# Patient Record
Sex: Male | Born: 1960 | Race: White | Hispanic: No | Marital: Married | Smoking: Current every day smoker
Health system: Southern US, Community
[De-identification: ages and names within clinical notes are randomized; demographics above are authoritative.]

## PROBLEM LIST (undated history)

## (undated) DIAGNOSIS — K625 Hemorrhage of anus and rectum: Secondary | ICD-10-CM

## (undated) DIAGNOSIS — M549 Dorsalgia, unspecified: Secondary | ICD-10-CM

## (undated) HISTORY — DX: Hemorrhage of anus and rectum: K62.5

## (undated) HISTORY — PX: CIRCUMCISION: SUR203

## (undated) HISTORY — DX: Dorsalgia, unspecified: M54.9

## (undated) HISTORY — PX: CHOLECYSTECTOMY: SHX55

---

## 2015-05-12 ENCOUNTER — Encounter: Payer: Self-pay | Admitting: Emergency Medicine

## 2015-05-12 ENCOUNTER — Ambulatory Visit
Admission: EM | Admit: 2015-05-12 | Discharge: 2015-05-12 | Disposition: A | Discharge status: Home / Self Care | Payer: Worker's Compensation | Attending: Family Medicine | Admitting: Family Medicine

## 2015-05-12 ENCOUNTER — Ambulatory Visit: Payer: Worker's Compensation

## 2015-05-12 DIAGNOSIS — S62629A Displaced fracture of medial phalanx of unspecified finger, initial encounter for closed fracture: Secondary | ICD-10-CM | POA: Diagnosis not present

## 2015-05-12 DIAGNOSIS — S62619A Displaced fracture of proximal phalanx of unspecified finger, initial encounter for closed fracture: Secondary | ICD-10-CM

## 2015-05-12 MED ORDER — HYDROCODONE-ACETAMINOPHEN 5-325 MG PO TABS
1.0000 | ORAL_TABLET | Freq: Four times a day (QID) | ORAL | Status: DC | PRN
Start: 2015-05-12 — End: 2016-07-04

## 2015-05-12 NOTE — Discharge Instructions (Signed)
Finger Fracture °Fractures of fingers are breaks in the bones of the fingers. There are many types of fractures. There are different ways of treating these fractures. Your health care provider will discuss the best way to treat your fracture. °CAUSES °Traumatic injury is the main cause of broken fingers. These include: °· Injuries while playing sports. °· Workplace injuries. °· Falls. °RISK FACTORS °Activities that can increase your risk of finger fractures include: °· Sports. °· Workplace activities that involve machinery. °· A condition called osteoporosis, which can make your bones less dense and cause them to fracture more easily. °SIGNS AND SYMPTOMS °The main symptoms of a broken finger are pain and swelling within 15 minutes after the injury. Other symptoms include: °· Bruising of your finger. °· Stiffness of your finger. °· Numbness of your finger. °· Exposed bones (compound fracture) if the fracture is severe. °DIAGNOSIS  °The best way to diagnose a broken bone is with X-ray imaging. Additionally, your health care provider will use this X-ray image to evaluate the position of the broken finger bones.  °TREATMENT  °Finger fractures can be treated with:  °· Nonreduction--This means the bones are in place. The finger is splinted without changing the positions of the bone pieces. The splint is usually left on for about a week to 10 days. This will depend on your fracture and what your health care provider thinks. °· Closed reduction--The bones are put back into position without using surgery. The finger is then splinted. °· Open reduction and internal fixation--The fracture site is opened. Then the bone pieces are fixed into place with pins or some type of hardware. This is seldom required. It depends on the severity of the fracture. °HOME CARE INSTRUCTIONS  °· Follow your health care provider's instructions regarding activities, exercises, and physical therapy. °· Only take over-the-counter or prescription  medicines for pain, discomfort, or fever as directed by your health care provider. °SEEK MEDICAL CARE IF: °You have pain or swelling that limits the motion or use of your fingers. °SEEK IMMEDIATE MEDICAL CARE IF:  °Your finger becomes numb. °MAKE SURE YOU:  °· Understand these instructions. °· Will watch your condition. °· Will get help right away if you are not doing well or get worse. °  °This information is not intended to replace advice given to you by your health care provider. Make sure you discuss any questions you have with your health care provider. °  °Document Released: 10/28/2000 Document Revised: 05/06/2013 Document Reviewed: 02/25/2013 °Elsevier Interactive Patient Education ©2016 [employer] ° °Cast or Splint Care °Casts and splints support injured limbs and keep bones from moving while they heal. It is important to care for your cast or splint at home.   °HOME CARE INSTRUCTIONS °· Keep the cast or splint uncovered during the drying period. It can take 24 to 48 hours to dry if it is made of plaster. A fiberglass cast will dry in less than 1 hour. °· Do not rest the cast on anything harder than a pillow for the first 24 hours. °· Do not put weight on your injured limb or apply pressure to the cast until your health care provider gives you permission. °· Keep the cast or splint dry. Wet casts or splints can lose their shape and may not support the limb as well. A wet cast that has lost its shape can also create harmful pressure on your skin when it dries. Also, wet skin can become infected. °¨ Cover the cast or splint with   a plastic bag when bathing or when out in the rain or snow. If the cast is on the trunk of the body, take sponge baths until the cast is removed. °¨ If your cast does become wet, dry it with a towel or a blow dryer on the cool setting only. °· Keep your cast or splint clean. Soiled casts may be wiped with a moistened cloth. °· Do not place any hard or soft foreign objects under your  cast or splint, such as cotton, toilet paper, lotion, or powder. °· Do not try to scratch the skin under the cast with any object. The object could get stuck inside the cast. Also, scratching could lead to an infection. If itching is a problem, use a blow dryer on a cool setting to relieve discomfort. °· Do not trim or cut your cast or remove padding from inside of it. °· Exercise all joints next to the injury that are not immobilized by the cast or splint. For example, if you have a long leg cast, exercise the hip joint and toes. If you have an arm cast or splint, exercise the shoulder, elbow, thumb, and fingers. °· Elevate your injured arm or leg on 1 or 2 pillows for the first 1 to 3 days to decrease swelling and pain. It is best if you can comfortably elevate your cast so it is higher than your heart. °SEEK MEDICAL CARE IF:  °· Your cast or splint cracks. °· Your cast or splint is too tight or too loose. °· You have unbearable itching inside the cast. °· Your cast becomes wet or develops a soft spot or area. °· You have a bad smell coming from inside your cast. °· You get an object stuck under your cast. °· Your skin around the cast becomes red or raw. °· You have new pain or worsening pain after the cast has been applied. °SEEK IMMEDIATE MEDICAL CARE IF:  °· You have fluid leaking through the cast. °· You are unable to move your fingers or toes. °· You have discolored (blue or white), cool, painful, or very swollen fingers or toes beyond the cast. °· You have tingling or numbness around the injured area. °· You have severe pain or pressure under the cast. °· You have any difficulty with your breathing or have shortness of breath. °· You have chest pain. °  °This information is not intended to replace advice given to you by your health care provider. Make sure you discuss any questions you have with your health care provider. °  °Document Released: 07/13/2000 Document Revised: 05/06/2013 Document Reviewed:  01/22/2013 °Elsevier Interactive Patient Education ©2016 Elsevier Inc. ° °

## 2015-05-12 NOTE — ED Provider Notes (Signed)
CSN: [id number]     Arrival date & time 05/12/15  1455 History   First MD Initiated Contact with Patient 05/12/15 1547     Chief Complaint  Patient presents with  . 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 There is ecchymosis volarly but most of his pain is perceived on the dorsum.  History reviewed. No pertinent past medical history. Past Surgical History  Procedure Laterality Date  . Cholecystectomy     History reviewed. No pertinent family history. Social History  Substance Use Topics  . Smoking status: Current Some Day Smoker  . Smokeless tobacco: None  . Alcohol Use: No    Review of Systems  Constitutional: Positive for activity change.  Musculoskeletal: Positive for arthralgias.  Skin: Positive for color change.  All other systems reviewed and are negative.   Allergies  Review of patient's allergies indicates no known allergies.  Home Medications   Prior to Admission medications   Medication Sig Start Date End Date Taking? Authorizing Provider  HYDROcodone-acetaminophen (NORCO/VICODIN) 5-325 MG tablet Take 1-2 tablets by mouth every 6 (six) hours as needed for severe pain. 05/12/15   Lorin Picket, PA-C   Meds Ordered and Administered this Visit  Medications - No data to display  BP 117/82 mmHg  Pulse 64  Temp(Src) 97.6 F (36.4 C) (Tympanic)  Resp 20  Ht 6\' 5"  (1.956 m)  Wt 287 lb (130.182 kg)  BMI 34.03 kg/m2  SpO2 100% No data found.   Physical Exam  Constitutional: He is oriented to person, place, and time. He appears well-developed and well-nourished.  HENT:  Head: Normocephalic and atraumatic.  Eyes:  Pupils are equal, round, and reactive to light.  Musculoskeletal: He exhibits edema and tenderness.  Examination of his right hand shows ecchymosis volarly over the PIP joint and proximal phalanx of the fifth finger. Tendons FDS and FDP are strong and show good pull-through. He has very little discomfort to palpation over the volar surface but is mostly tender over the dorsum at the PIP joint. He has normal sensation to light touch. Is no ligamentous instability that I can ascertain. The volar plate appears to be intact.  Neurological: He is alert and oriented to person, place, and time.  Skin: Skin is warm and dry.  Psychiatric: He has a normal mood and affect. His behavior is normal. Judgment and thought content normal.  Nursing note and vitals reviewed.   ED Course  Procedures (including critical care time)  Labs Review Labs Reviewed - No data to display  Imaging Review Dg Finger Little Right  05/12/2015  CLINICAL DATA:  Hyperextension of the right fifth finger with pain in the PIP joint EXAM: RIGHT LITTLE FINGER 2+V COMPARISON:  None. FINDINGS: There is a small bony density adjacent to the base of the middle phalanx of the right fifth digit on the palm are aspect consistent with small avulsion fracture fragment. No other acute abnormality is seen. Joint spaces appear normal. IMPRESSION: Probable small avulsion fracture fragment from the base of the middle phalanx of the right fifth finger. Electronically Signed   By: Ivar Drape M.D.   On: 05/12/2015 16:21     Visual Acuity Review  Right Eye Distance:   Left Eye  Distance:   Bilateral Distance:    Right Eye Near:   Left Eye Near:    Bilateral Near:         MDM   1. Avulsion fracture of proximal phalanx of finger, closed, initial encounter    Discharge Medication List as of 05/12/2015  5:19 PM    START taking these medications   Details  HYDROcodone-acetaminophen (NORCO/VICODIN) 5-325 MG tablet Take 1-2 tablets by mouth  every 6 (six) hours as needed for severe pain., Starting 05/12/2015, Until Discontinued, Print      Plan: 1. Test/x-ray results and diagnosis reviewed with patient 2. rx as per orders; risks, benefits, potential side effects reviewed with patient 3. Recommend supportive treatment with relation ice. A dorsal aluminum splint was placed buddy taping of his fourth to fifth finger. 4. F/u with orthopedic surgeon. Provided them the name of a Psychologist, sport and exercise at [facility] orthopedics. Make an appointment for next week. Also given him my.light-duty excuse for work and keep him out of work until Monday of next week.    Lorin Picket, PA-C 05/12/15 [phone]

## 2015-05-12 NOTE — ED Notes (Signed)
Pt has pain in right hand 5th finger

## 2016-07-04 ENCOUNTER — Ambulatory Visit (INDEPENDENT_AMBULATORY_CARE_PROVIDER_SITE_OTHER): Payer: Managed Care, Other (non HMO) | Admitting: Gastroenterology

## 2016-07-04 ENCOUNTER — Other Ambulatory Visit: Payer: Self-pay

## 2016-07-04 ENCOUNTER — Encounter: Payer: Self-pay | Admitting: Gastroenterology

## 2016-07-04 VITALS — BP 132/83 | HR 70 | Temp 97.8°F | Ht 73.0 in | Wt 294.0 lb

## 2016-07-04 DIAGNOSIS — K921 Melena: Secondary | ICD-10-CM | POA: Diagnosis not present

## 2016-07-04 DIAGNOSIS — K219 Gastro-esophageal reflux disease without esophagitis: Secondary | ICD-10-CM | POA: Diagnosis not present

## 2016-07-04 MED ORDER — PEG 3350-KCL-NABCB-NACL-NASULF 236 G PO SOLR: ORAL | 0 refills | Status: AC

## 2016-07-04 NOTE — Progress Notes (Addendum)
Gastroenterology Consultation  Referring Provider:     No ref. provider found Primary Care Physician:  No primary care provider on file. Primary Gastroenterologist:  Dr. Allen Norris     Reason for Consultation:     Rectal bleeding        HPI:   Chris Romero is a 55 y.o. y/o male referred for consultation & management of Rectal bleeding by Dr. Rayne Du primary care provider on file..  This patient comes in today with his wife because of a history of rectal bleeding. The patient states he had rectal bleeding after bowel movements with bright red blood. There is no report of any family history of colon cancer colon polyps. The patient also reports that was happening whenever he was moving his bowels and he would not bleed between bowel movements. The patient also states that it was worse when he was constipated. The patient has never had a colonoscopy in the past. He also reports that the bleeding had stopped proctoscopy 2 weeks ago. He denies any nausea vomiting fevers or chills. There is also no report of any change in bowel movements. The patient does have some intermittent heartburn for which she takes Tums and reports some epigastric tenderness.  Past Medical History:  Diagnosis Date  . Back pain   . Rectal bleeding     Past Surgical History:  Procedure Laterality Date  . CHOLECYSTECTOMY      Prior to Admission medications   Medication Sig Start Date End Date Taking? Authorizing Provider  diclofenac (VOLTAREN) 75 MG EC tablet Take 1 tablet by mouth 1 day or 1 dose. 06/01/16  Yes Historical Provider, MD  polyethylene glycol (GOLYTELY) 236 g solution Drink one 8 oz glass every 20 mins until stools are clear 07/04/16   Lucilla Lame, MD    Family History  Problem Relation Age of Onset  . Stroke Mother   . Cataracts Father   . Dementia Father      Social History  Substance Use Topics  . Smoking status: Current Some Day Smoker  . Smokeless tobacco: Never Used  . Alcohol use No     Allergies as of 07/04/2016  . (No Known Allergies)    Review of Systems:    All systems reviewed and negative except where noted in HPI.   Physical Exam:  BP 132/83   Pulse 70   Temp 97.8 F (36.6 C) (Oral)   Ht 6\' 1"  (1.854 m)   Wt 294 lb (133.4 kg)   BMI 38.79 kg/m  No LMP for male patient. Psych:  Alert and cooperative. Normal mood and affect. General:   Alert,  Well-developed, well-nourished, pleasant and cooperative in NAD Head:  Normocephalic and atraumatic. Eyes:  Sclera clear, no icterus.   Conjunctiva pink. Ears:  Normal auditory acuity. Nose:  No deformity, discharge, or lesions. Mouth:  No deformity or lesions,oropharynx pink & moist. Neck:  Supple; no masses or thyromegaly. Lungs:  Respirations even and unlabored.  Clear throughout to auscultation.   No wheezes, crackles, or rhonchi. No acute distress. Heart:  Regular rate and rhythm; no murmurs, clicks, rubs, or gallops. Abdomen:  Normal bowel sounds.  No bruits.  Soft, epigastric tenderness and non-distended without masses, hepatosplenomegaly or hernias noted.  No guarding or rebound tenderness.  Negative Carnett sign.   Rectal:  Deferred.  Msk:  Symmetrical without gross deformities.  Good, equal movement & strength bilaterally. Pulses:  Normal pulses noted. Extremities:  No clubbing or edema.  No  cyanosis. Neurologic:  Alert and oriented x3;  grossly normal neurologically. Skin:  Intact without significant lesions or rashes.  No jaundice. Lymph Nodes:  No significant cervical adenopathy. Psych:  Alert and cooperative. Normal mood and affect.  Imaging Studies: No results found.  Assessment and Plan:   Chris Romero is a 55 y.o. y/o male who comes in today with a history of rectal bleeding. The patient states that the rectal bleeding stopped approximate 2 weeks ago. It was bright red in color and on the toilet paper. The patient does reported to be a large amount of blood. The patient has never had a  colonoscopy. The patient will be set up for a colonoscopy to ascertain the source of his rectal bleeding. The patient will also be given samples of the Dexilant to see if this helps his heartburn issues. I have discussed risks & benefits which include, but are not limited to, bleeding, infection, perforation & drug reaction.  The patient agrees with this plan & written consent will be obtained.       Lucilla Lame, MD. Marval Regal   Note: This dictation was prepared with Dragon dictation along with smaller phrase technology. Any transcriptional errors that result from this process are unintentional.

## 2016-07-09 ENCOUNTER — Other Ambulatory Visit: Payer: Self-pay

## 2016-07-19 ENCOUNTER — Encounter: Payer: Self-pay | Admitting: *Deleted

## 2016-07-25 NOTE — Discharge Instructions (Signed)

## 2016-07-26 ENCOUNTER — Encounter
Admission: RE | Disposition: A | Discharge status: Home / Self Care | Payer: Self-pay | Source: Ambulatory Visit | Attending: Gastroenterology

## 2016-07-26 ENCOUNTER — Ambulatory Visit: Payer: Managed Care, Other (non HMO) | Admitting: Anesthesiology

## 2016-07-26 ENCOUNTER — Ambulatory Visit
Admission: RE | Admit: 2016-07-26 | Discharge: 2016-07-26 | Disposition: A | Discharge status: Home / Self Care | Payer: Managed Care, Other (non HMO) | Source: Ambulatory Visit | Attending: Gastroenterology | Admitting: Gastroenterology

## 2016-07-26 DIAGNOSIS — F17210 Nicotine dependence, cigarettes, uncomplicated: Secondary | ICD-10-CM | POA: Diagnosis not present

## 2016-07-26 DIAGNOSIS — D125 Benign neoplasm of sigmoid colon: Secondary | ICD-10-CM

## 2016-07-26 DIAGNOSIS — Z1211 Encounter for screening for malignant neoplasm of colon: Secondary | ICD-10-CM | POA: Diagnosis not present

## 2016-07-26 DIAGNOSIS — K921 Melena: Secondary | ICD-10-CM | POA: Diagnosis not present

## 2016-07-26 DIAGNOSIS — D123 Benign neoplasm of transverse colon: Secondary | ICD-10-CM | POA: Diagnosis not present

## 2016-07-26 DIAGNOSIS — K635 Polyp of colon: Secondary | ICD-10-CM | POA: Diagnosis not present

## 2016-07-26 DIAGNOSIS — K640 First degree hemorrhoids: Secondary | ICD-10-CM | POA: Diagnosis not present

## 2016-07-26 HISTORY — PX: COLONOSCOPY WITH PROPOFOL: SHX5780

## 2016-07-26 HISTORY — PX: POLYPECTOMY: SHX5525

## 2016-07-26 SURGERY — COLONOSCOPY WITH PROPOFOL
Anesthesia: Monitor Anesthesia Care | Site: Rectum | Wound class: Contaminated

## 2016-07-26 MED ORDER — PROPOFOL 10 MG/ML IV BOLUS
INTRAVENOUS | Status: DC | PRN
  Administered 2016-07-26 (×3): 50 mg via INTRAVENOUS
  Administered 2016-07-26: 100 mg via INTRAVENOUS
  Administered 2016-07-26 (×2): 50 mg via INTRAVENOUS

## 2016-07-26 MED ORDER — LACTATED RINGERS IV SOLN
INTRAVENOUS | Status: DC
  Administered 2016-07-26: 08:00:00 via INTRAVENOUS

## 2016-07-26 MED ORDER — STERILE WATER FOR IRRIGATION IR SOLN
Status: DC | PRN
  Administered 2016-07-26: 10:00:00

## 2016-07-26 MED ORDER — LACTATED RINGERS IV SOLN: 500.0000 mL | INTRAVENOUS | Status: DC

## 2016-07-26 MED ORDER — LIDOCAINE HCL (CARDIAC) 20 MG/ML IV SOLN
INTRAVENOUS | Status: DC | PRN
  Administered 2016-07-26: 50 mg via INTRAVENOUS

## 2016-07-26 SURGICAL SUPPLY — 23 items

## 2016-07-26 NOTE — Anesthesia Procedure Notes (Signed)
Procedure Name: MAC Performed by: Dwyane Dupree Pre-anesthesia Checklist: Patient identified, Emergency Drugs available, Suction available, Timeout performed and Patient being monitored Patient Re-evaluated:Patient Re-evaluated prior to inductionOxygen Delivery Method: Nasal cannula Placement Confirmation: positive ETCO2     

## 2016-07-26 NOTE — H&P (Addendum)
  Lucilla Lame, MD [facility] [address], [place], [place] [address] Phone: [phone] Fax : [phone]  Primary Care Physician:  No primary care provider on file. Primary Gastroenterologist:  Dr. Allen Norris  Pre-Procedure History & Physical: HPI:  Chris Romero is a 55 y.o. male is here for an colonoscopy.   Past Medical History:  Diagnosis Date  . Back pain   . Rectal bleeding     Past Surgical History:  Procedure Laterality Date  . CHOLECYSTECTOMY    . CIRCUMCISION     age 26    Prior to Admission medications   Medication Sig Start Date End Date Taking? Authorizing Provider  polyethylene glycol (GOLYTELY) 236 g solution Drink one 8 oz glass every 20 mins until stools are clear 07/04/16  Yes Lucilla Lame, MD    Allergies as of 07/09/2016  . (No Known Allergies)    Family History  Problem Relation Age of Onset  . Stroke Mother   . Cataracts Father   . Dementia Father     Social History   Social History  . Marital status: Married    Spouse name: N/A  . Number of children: N/A  . Years of education: N/A   Occupational History  . Not on file.   Social History Main Topics  . Smoking status: Current Every Day Smoker    Packs/day: 0.50    Years: 30.00  . Smokeless tobacco: Never Used     Comment: off and on  . Alcohol use Yes     Comment: 2x/month  . Drug use: No  . Sexual activity: Not on file   Other Topics Concern  . Not on file   Social History Narrative  . No narrative on file    Review of Systems: See HPI, otherwise negative ROS  Physical Exam: BP 100/65   Pulse 67   Temp 97.5 F (36.4 C) (Tympanic)   Resp 16   Ht 6\' 1"  (1.854 m)   Wt 288 lb (130.6 kg)   SpO2 97%   BMI 38.00 kg/m  General:   Alert,  pleasant and cooperative in NAD Head:  Normocephalic and atraumatic. Neck:  Supple; no masses or thyromegaly. Lungs:  Clear throughout to auscultation.    Heart:  Regular rate and rhythm. Abdomen:  Soft, nontender and nondistended.  Normal bowel sounds, without guarding, and without rebound.   Neurologic:  Alert and  oriented x4;  grossly normal neurologically.  Impression/Plan: Kashten Shuey Bail is here for an colonoscopy to be performed for screenig  Risks, benefits, limitations, and alternatives regarding  colonoscopy have been reviewed with the patient.  Questions have been answered.  All parties agreeable.   Lucilla Lame, MD  07/26/2016, 8:28 AM

## 2016-07-26 NOTE — Op Note (Addendum)
[facility] Gastroenterology Patient Name: Chris Romero Procedure Date: 07/26/2016 9:29 AM MRN: [id number] Account #: [id number] Date of Birth: 14-Dec-1960 Admit Type: Outpatient Age: 55 Room: [facility] OR ROOM 01 Gender: Male Note Status: Supervisor Override Procedure:            Colonoscopy Indications:          Screening for colorectal malignant neoplasm, Screening Providers:            Lucilla Lame MD, MD Medicines:            Propofol per Anesthesia Complications:        No immediate complications. Procedure:            Pre-Anesthesia Assessment:                       - Prior to the procedure, a History and Physical was                        performed, and patient medications and allergies were                        reviewed. The patient's tolerance of previous                        anesthesia was also reviewed. The risks and benefits of                        the procedure and the sedation options and risks were                        discussed with the patient. All questions were                        answered, and informed consent was obtained. Prior                        Anticoagulants: The patient has taken no previous                        anticoagulant or antiplatelet agents. ASA Grade                        Assessment: II - A patient with mild systemic disease.                        After reviewing the risks and benefits, the patient was                        deemed in satisfactory condition to undergo the                        procedure.                       After obtaining informed consent, the colonoscope was                        passed under direct vision. Throughout the procedure,                        the patient's  blood pressure, pulse, and oxygen                        saturations were monitored continuously. The Olympus CF                        H180AL colonoscope (S#: [id number]) was introduced through                        the anus and  advanced to the the cecum, identified by                        appendiceal orifice and ileocecal valve. The                        colonoscopy was performed without difficulty. The                        patient tolerated the procedure well. The quality of                        the bowel preparation was excellent. Findings:      The perianal and digital rectal examinations were normal.      A 4 mm polyp was found in the transverse colon. The polyp was sessile.       The polyp was removed with a cold snare. Resection and retrieval were       complete.      A 4 mm polyp was found in the sigmoid colon. The polyp was sessile. The       polyp was removed with a cold snare. Resection and retrieval were       complete.      Non-bleeding internal hemorrhoids were found during retroflexion. The       hemorrhoids were Grade II (internal hemorrhoids that prolapse but reduce       spontaneously). Impression:           - One 4 mm polyp in the transverse colon, removed with                        a cold snare. Resected and retrieved.                       - One 4 mm polyp in the sigmoid colon, removed with a                        cold snare. Resected and retrieved.                       - Non-bleeding internal hemorrhoids. Recommendation:       - Discharge patient to home.                       - Resume previous diet.                       - Continue present medications.                       - Await pathology results.                       -  Repeat colonoscopy in 5 years if polyp adenoma and 10                        years if hyperplastic Procedure Code(s):    --- Professional ---                       [phone], Colonoscopy, flexible; with removal of tumor(s),                        polyp(s), or other lesion(s) by snare technique Diagnosis Code(s):    --- Professional ---                       Z12.11, Encounter for screening for malignant neoplasm                        of colon                        D12.3, Benign neoplasm of transverse colon (hepatic                        flexure or splenic flexure)                       D12.5, Benign neoplasm of sigmoid colon CPT copyright 2016 American Medical Association. All rights reserved. The codes documented in this report are preliminary and upon coder review may  be revised to meet current compliance requirements. Lucilla Lame MD, MD 07/26/2016 9:59:51 AM This report has been signed electronically. Number of Addenda: 0 Note Initiated On: 07/26/2016 9:29 AM Scope Withdrawal Time: 0 hours 8 minutes 46 seconds  Total Procedure Duration: 0 hours 12 minutes 55 seconds       [facility]

## 2016-07-26 NOTE — Transfer of Care (Signed)
Immediate Anesthesia Transfer of Care Note  Patient: Chris Romero  Procedure(s) Performed: Procedure(s): COLONOSCOPY WITH PROPOFOL (N/A) POLYPECTOMY (N/A)  Patient Location: PACU  Anesthesia Type: MAC  Level of Consciousness: awake, alert  and patient cooperative  Airway and Oxygen Therapy: Patient Spontanous Breathing and Patient connected to supplemental oxygen  Post-op Assessment: Post-op Vital signs reviewed, Patient's Cardiovascular Status Stable, Respiratory Function Stable, Patent Airway and No signs of Nausea or vomiting  Post-op Vital Signs: Reviewed and stable  Complications: No apparent anesthesia complications

## 2016-07-26 NOTE — Anesthesia Preprocedure Evaluation (Signed)
Anesthesia Evaluation  Patient identified by MRN, date of birth, ID band Patient awake    Reviewed: Allergy & Precautions, H&P , NPO status , Patient's Chart, lab work & pertinent test results, reviewed documented beta blocker date and time   Airway Mallampati: II  TM Distance: >3 FB Neck ROM: full    Dental no notable dental hx.    Pulmonary neg pulmonary ROS, Current Smoker,    Pulmonary exam normal breath sounds clear to auscultation       Cardiovascular Exercise Tolerance: Good negative cardio ROS   Rhythm:regular Rate:Normal     Neuro/Psych negative neurological ROS  negative psych ROS   GI/Hepatic negative GI ROS, Neg liver ROS,   Endo/Other  negative endocrine ROS  Renal/GU negative Renal ROS  negative genitourinary   Musculoskeletal   Abdominal   Peds  Hematology negative hematology ROS (+)   Anesthesia Other Findings   Reproductive/Obstetrics negative OB ROS                             Anesthesia Physical Anesthesia Plan  ASA: II  Anesthesia Plan: MAC   Post-op Pain Management:    Induction:   Airway Management Planned:   Additional Equipment:   Intra-op Plan:   Post-operative Plan:   Informed Consent: I have reviewed the patients History and Physical, chart, labs and discussed the procedure including the risks, benefits and alternatives for the proposed anesthesia with the patient or authorized representative who has indicated his/her understanding and acceptance.     Plan Discussed with: CRNA  Anesthesia Plan Comments:         Anesthesia Quick Evaluation

## 2016-07-26 NOTE — Anesthesia Postprocedure Evaluation (Signed)
Anesthesia Post Note  Patient: Chris Romero  Procedure(s) Performed: Procedure(s) (LRB): COLONOSCOPY WITH PROPOFOL (N/A) POLYPECTOMY (N/A)  Patient location during evaluation: PACU Anesthesia Type: MAC Level of consciousness: awake and alert Pain management: pain level controlled Vital Signs Assessment: post-procedure vital signs reviewed and stable Respiratory status: spontaneous breathing, nonlabored ventilation and respiratory function stable Cardiovascular status: stable and blood pressure returned to baseline Anesthetic complications: no    Asianae Minkler D Ellery Meroney

## 2016-07-27 ENCOUNTER — Encounter: Payer: Self-pay | Admitting: Gastroenterology

## 2016-07-31 ENCOUNTER — Encounter: Payer: Self-pay | Admitting: Gastroenterology

## 2019-05-08 ENCOUNTER — Other Ambulatory Visit: Payer: Self-pay

## 2019-05-08 DIAGNOSIS — Z20822 Contact with and (suspected) exposure to covid-19: Secondary | ICD-10-CM

## 2019-05-09 LAB — NOVEL CORONAVIRUS, NAA: SARS-CoV-2, NAA: NOT DETECTED

## 2020-01-25 ENCOUNTER — Other Ambulatory Visit: Payer: Self-pay

## 2020-01-25 ENCOUNTER — Ambulatory Visit
Admission: EM | Admit: 2020-01-25 | Discharge: 2020-01-25 | Disposition: A | Discharge status: Home / Self Care | Payer: Worker's Compensation | Attending: Family Medicine | Admitting: Family Medicine

## 2020-01-25 DIAGNOSIS — Z23 Encounter for immunization: Secondary | ICD-10-CM

## 2020-01-25 DIAGNOSIS — S60459A Superficial foreign body of unspecified finger, initial encounter: Secondary | ICD-10-CM

## 2020-01-25 MED ORDER — TETANUS-DIPHTH-ACELL PERTUSSIS 5-2.5-18.5 LF-MCG/0.5 IM SUSP
0.5000 mL | Freq: Once | INTRAMUSCULAR | Status: AC
Start: 2020-01-25 — End: 2020-01-25
  Administered 2020-01-25: 10:00:00 0.5 mL via INTRAMUSCULAR

## 2020-01-25 NOTE — Discharge Instructions (Signed)
Keep clean.  Keep covered at work.  Take care  Dr. Lacinda Axon

## 2020-01-25 NOTE — ED Provider Notes (Signed)
[facility]    CSN: [id number] Arrival date & time: 01/25/20  2094      History   Chief Complaint Chief Complaint  Patient presents with  . Foreign Body in Skin  . Worker's Compensation   HPI  59 year old male presents with the above complaints.  Patient was at work today and was fixing a spare part.  Piece of metal got and lodged in his left fourth digit.  Mild pain, 3/10 in severity.  No bleeding.  Patient is requesting foreign body removal today.  No other associated symptoms.  No other complaints.  Patient feels that he may return to work after this is removed.  Past Medical History:  Diagnosis Date  . Back pain   . Rectal bleeding     Patient Active Problem List   Diagnosis Date Noted  . Blood in stool   . Benign neoplasm of transverse colon   . Polyp of sigmoid colon     Past Surgical History:  Procedure Laterality Date  . CHOLECYSTECTOMY    . CIRCUMCISION     age 18  . COLONOSCOPY WITH PROPOFOL N/A 07/26/2016   Procedure: COLONOSCOPY WITH PROPOFOL;  Surgeon: Lucilla Lame, MD;  Location: [place];  Service: Endoscopy;  Laterality: N/A;  . POLYPECTOMY N/A 07/26/2016   Procedure: POLYPECTOMY;  Surgeon: Lucilla Lame, MD;  Location: [place];  Service: Endoscopy;  Laterality: N/A;       Home Medications    Prior to Admission medications   Medication Sig Start Date End Date Taking? Authorizing Provider  polyethylene glycol (GOLYTELY) 236 g solution Drink one 8 oz glass every 20 mins until stools are clear 07/04/16   Lucilla Lame, MD    Family History Family History  Problem Relation Age of Onset  . Stroke Mother   . Cataracts Father   . Dementia Father     Social History Social History   Tobacco Use  . Smoking status: Former Smoker    Packs/day: 0.50    Years: 30.00    Pack years: 15.00  . Smokeless tobacco: Never Used  . Tobacco comment: off and on  Substance Use Topics  . Alcohol use: Yes    Comment: 2x/month   . Drug use: No     Allergies   Patient has no known allergies.   Review of Systems Review of Systems  Skin:       Foreign body, finger.    Physical Exam Triage Vital Signs ED Triage Vitals  Enc Vitals Group     BP 01/25/20 0930 125/74     Pulse Rate 01/25/20 0930 65     Resp 01/25/20 0930 18     Temp 01/25/20 0930 97.9 F (36.6 C)     Temp Source 01/25/20 0930 Oral     SpO2 01/25/20 0930 98 %     Weight 01/25/20 0929 250 lb (113.4 kg)     Height 01/25/20 0929 6\' 1"  (1.854 m)     Head Circumference --      Peak Flow --      Pain Score 01/25/20 0928 3     Pain Loc --      Pain Edu? --      Excl. in [place]? --    Updated Vital Signs BP 125/74 (BP Location: Right Arm)   Pulse 65   Temp 97.9 F (36.6 C) (Oral)   Resp 18   Ht 6\' 1"  (1.854 m)   Wt 113.4 kg  SpO2 98%   BMI 32.98 kg/m   Visual Acuity Right Eye Distance:   Left Eye Distance:   Bilateral Distance:    Right Eye Near:   Left Eye Near:    Bilateral Near:     Physical Exam Vitals and nursing note reviewed.  Constitutional:      General: He is not in acute distress.    Appearance: Normal appearance. He is not ill-appearing.  HENT:     Head: Normocephalic and atraumatic.  Eyes:     General:        Right eye: No discharge.        Left eye: No discharge.     Conjunctiva/sclera: Conjunctivae normal.  Pulmonary:     Effort: Pulmonary effort is normal. No respiratory distress.  Skin:    Comments: Left fourth digit -distal finger with a visible dark foreign body on the palmar aspect.  Neurological:     Mental Status: He is alert.  Psychiatric:        Mood and Affect: Mood normal.        Behavior: Behavior normal.    UC Treatments / Results  Labs (all labs ordered are listed, but only abnormal results are displayed) Labs Reviewed - No data to display  EKG   Radiology No results found.  Procedures Foreign Body Removal  Date/Time: 01/25/2020 10:58 AM Performed by: Coral Spikes,  DO Authorized by: Coral Spikes, DO   Consent:    Consent obtained:  Verbal   Consent given by:  Patient Location:    Location:  Finger   Finger location:  L ring finger   Depth:  Intradermal   Tendon involvement:  None Pre-procedure details:    Imaging:  None   Neurovascular status: intact   Anesthesia (see MAR for exact dosages):    Anesthesia method:  Local infiltration   Local anesthetic:  Lidocaine 1% w/o epi Procedure type:    Procedure complexity:  Simple Procedure details:    Scalpel size:  11   Localization method:  Visualized   Removal mechanism:  Forceps   Foreign bodies recovered:  1   Description:  Small piece of metal   Intact foreign body removal: yes   Post-procedure details:    Confirmation:  No additional foreign bodies on visualization   Skin closure:  None   Dressing:  Antibiotic ointment and non-adherent dressing   Patient tolerance of procedure:  Tolerated well, no immediate complications   (including critical care time)  Medications Ordered in UC Medications  Tdap (BOOSTRIX) injection 0.5 mL (0.5 mLs Intramuscular Given 01/25/20 0935)    Initial Impression / Assessment and Plan / UC Course  I have reviewed the triage vital signs and the nursing notes.  Pertinent labs & imaging results that were available during my care of the patient were reviewed by me and considered in my medical decision making (see chart for details).    59 year old male presents with a foreign body in his finger.  Foreign body removed as outlined above.  Wound was dressed.  Workmen's Comp. form filled out.  May return to work.  Final Clinical Impressions(s) / UC Diagnoses   Final diagnoses:  Foreign body in skin of finger, initial encounter     Discharge Instructions     Keep clean.  Keep covered at work.  Take care  Dr. Lacinda Axon    ED Prescriptions    None     PDMP not reviewed this encounter.   Lacinda Axon,  Alanea Woolridge G, DO 01/25/20 1103

## 2020-01-25 NOTE — ED Triage Notes (Signed)
At work this morning was fixing a spare part and a piece of steel got into his left hand 4th finger.

## 2020-01-25 NOTE — ED Notes (Signed)
Antibiotic ointment applied, non-stick dressing and coban to wound.

## 2020-08-04 ENCOUNTER — Other Ambulatory Visit: Payer: Self-pay

## 2020-08-04 DIAGNOSIS — Z20822 Contact with and (suspected) exposure to covid-19: Secondary | ICD-10-CM

## 2020-08-07 LAB — NOVEL CORONAVIRUS, NAA: SARS-CoV-2, NAA: NOT DETECTED

## 2021-04-25 ENCOUNTER — Telehealth: Payer: Self-pay

## 2021-04-25 NOTE — Telephone Encounter (Signed)
CALLED PATIENT NO ANSWER LEFT VOICEMAIL FOR A CALL BACK ? ?

## 2021-04-25 NOTE — Telephone Encounter (Signed)
Pt. Wants to scheduel colonoscopy but wants to know how much it will cost and if itr will be a preventative this time.

## 2021-04-27 ENCOUNTER — Telehealth: Payer: Self-pay

## 2021-04-27 NOTE — Telephone Encounter (Signed)
Pt. Calling to schedule colonoscopy. Wife wanted an appt at first but was advised that it has been over 5 years since her husband has seen Dr. Allen Norris and he would need a referral. So now she just wants husband to just have a colonoscopy.

## 2021-04-27 NOTE — Telephone Encounter (Signed)
CALLED PATIENT NO ANSWER LEFT VOICEMAIL FOR A CALL BACK ? ?

## 2021-05-01 ENCOUNTER — Ambulatory Visit: Admission: RE | Admit: 2021-05-01 | Payer: 59 | Source: Home / Self Care

## 2021-05-01 ENCOUNTER — Encounter: Admission: RE | Payer: Self-pay | Source: Home / Self Care

## 2021-05-01 SURGERY — EGD (ESOPHAGOGASTRODUODENOSCOPY)
Anesthesia: General

## 2021-05-10 ENCOUNTER — Telehealth: Payer: Self-pay

## 2021-05-10 ENCOUNTER — Other Ambulatory Visit: Payer: Self-pay

## 2021-05-10 NOTE — Progress Notes (Signed)
Letter being sent

## 2021-05-10 NOTE — Progress Notes (Signed)
Patient does not want to have colonoscopy done because he doesn't want to file his insurance so patient stated he did not want procedure done

## 2021-05-10 NOTE — Telephone Encounter (Signed)
Had interp id # [id number] call twice no answer left voicemail for call back

## 2021-12-27 ENCOUNTER — Ambulatory Visit
Admission: RE | Admit: 2021-12-27 | Discharge: 2021-12-27 | Disposition: A | Discharge status: Home / Self Care | Payer: PRIVATE HEALTH INSURANCE | Attending: Physician Assistant | Admitting: Physician Assistant

## 2021-12-27 ENCOUNTER — Other Ambulatory Visit: Payer: Self-pay | Admitting: Physician Assistant

## 2021-12-27 ENCOUNTER — Ambulatory Visit
Admission: RE | Admit: 2021-12-27 | Discharge: 2021-12-27 | Disposition: A | Discharge status: Home / Self Care | Payer: PRIVATE HEALTH INSURANCE | Source: Ambulatory Visit | Attending: Physician Assistant | Admitting: Physician Assistant

## 2021-12-27 DIAGNOSIS — R52 Pain, unspecified: Secondary | ICD-10-CM

## 2021-12-27 DIAGNOSIS — T1490XA Injury, unspecified, initial encounter: Secondary | ICD-10-CM | POA: Insufficient documentation

## 2022-01-03 ENCOUNTER — Ambulatory Visit
Admission: RE | Admit: 2022-01-03 | Discharge: 2022-01-03 | Disposition: A | Discharge status: Home / Self Care | Payer: PRIVATE HEALTH INSURANCE | Source: Ambulatory Visit | Attending: Physician Assistant | Admitting: Physician Assistant

## 2022-01-03 ENCOUNTER — Other Ambulatory Visit: Payer: Self-pay | Admitting: Physician Assistant

## 2022-01-03 DIAGNOSIS — Y939 Activity, unspecified: Secondary | ICD-10-CM | POA: Diagnosis not present

## 2022-01-03 DIAGNOSIS — M8448XA Pathological fracture, other site, initial encounter for fracture: Secondary | ICD-10-CM | POA: Diagnosis not present

## 2022-01-03 DIAGNOSIS — Y990 Civilian activity done for income or pay: Secondary | ICD-10-CM

## 2022-03-07 ENCOUNTER — Other Ambulatory Visit: Payer: Self-pay

## 2022-03-07 ENCOUNTER — Ambulatory Visit
Admission: EM | Admit: 2022-03-07 | Discharge: 2022-03-07 | Disposition: A | Discharge status: Home / Self Care | Payer: 59 | Attending: Family Medicine | Admitting: Family Medicine

## 2022-03-07 ENCOUNTER — Ambulatory Visit: Payer: Self-pay

## 2022-03-07 ENCOUNTER — Encounter: Payer: Self-pay | Admitting: *Deleted

## 2022-03-07 DIAGNOSIS — M5441 Lumbago with sciatica, right side: Secondary | ICD-10-CM | POA: Diagnosis not present

## 2022-03-07 MED ORDER — PREDNISONE 20 MG PO TABS: 40.0000 mg | ORAL_TABLET | Freq: Every day | ORAL | 0 refills | Status: DC

## 2022-03-07 MED ORDER — TIZANIDINE HCL 4 MG PO TABS: 4.0000 mg | ORAL_TABLET | Freq: Two times a day (BID) | ORAL | 0 refills | Status: AC | PRN

## 2022-03-07 MED ORDER — DEXAMETHASONE SODIUM PHOSPHATE 10 MG/ML IJ SOLN
10.0000 mg | Freq: Once | INTRAMUSCULAR | Status: AC
Start: 2022-03-07 — End: 2022-03-07
  Administered 2022-03-07: 10 mg via INTRAMUSCULAR

## 2022-03-07 MED ORDER — KETOROLAC TROMETHAMINE 30 MG/ML IJ SOLN
15.0000 mg | Freq: Once | INTRAMUSCULAR | Status: AC
  Administered 2022-03-07: 15 mg via INTRAMUSCULAR

## 2022-03-07 NOTE — ED Provider Notes (Signed)
[facility]    CSN: [id number] Arrival date & time: 03/07/22  1652      History   Chief Complaint Chief Complaint  Patient presents with   Back Pain    Entered by patient    HPI Chris Romero is a 61 y.o. male.   HPI Patient with history of chronic back pain related to remote injury and sciatica presents today with a family member who is assisting with interpretation. Patient occasionally has flare ups of back pain as he is very active. He attempted relief with OTC medication that was ineffective in reducing pain. In the past, he has achieved relief of back pain with prednisone and injection of Toradol. He has been wearing back brace for comfort. He denies any recent fall or injury. Denies any focal symptoms or difficulty urinating or passing bowel movements. Past Medical History:  Diagnosis Date   Back pain    Rectal bleeding     Patient Active Problem List   Diagnosis Date Noted   Blood in stool    Benign neoplasm of transverse colon    Polyp of sigmoid colon     Past Surgical History:  Procedure Laterality Date   CHOLECYSTECTOMY     CIRCUMCISION     age 35   COLONOSCOPY WITH PROPOFOL N/A 07/26/2016   Procedure: COLONOSCOPY WITH PROPOFOL;  Surgeon: Lucilla Lame, MD;  Location: [place];  Service: Endoscopy;  Laterality: N/A;   POLYPECTOMY N/A 07/26/2016   Procedure: POLYPECTOMY;  Surgeon: Lucilla Lame, MD;  Location: [place];  Service: Endoscopy;  Laterality: N/A;       Home Medications    Prior to Admission medications   Medication Sig Start Date End Date Taking? Authorizing Provider  predniSONE (DELTASONE) 20 MG tablet Take 2 tablets (40 mg total) by mouth daily with breakfast. 03/07/22  Yes Scot Jun, FNP  tiZANidine (ZANAFLEX) 4 MG tablet Take 1 tablet (4 mg total) by mouth every 12 (twelve) hours as needed for muscle spasms. 03/07/22  Yes Scot Jun, FNP  polyethylene glycol (GOLYTELY) 236 g solution Drink one 8  oz glass every 20 mins until stools are clear 07/04/16   Lucilla Lame, MD    Family History Family History  Problem Relation Age of Onset   Stroke Mother    Cataracts Father    Dementia Father     Social History Social History   Tobacco Use   Smoking status: Former    Packs/day: 0.50    Years: 30.00    Total pack years: 15.00    Types: Cigarettes   Smokeless tobacco: Never   Tobacco comments:    off and on  Substance Use Topics   Alcohol use: Yes    Comment: 2x/month   Drug use: No     Allergies   Patient has no known allergies.   Review of Systems Review of Systems Pertinent negatives listed in HPI   Physical Exam Triage Vital Signs ED Triage Vitals [03/07/22 1739]  Enc Vitals Group     BP      Pulse      Resp      Temp      Temp src      SpO2      Weight      Height      Head Circumference      Peak Flow      Pain Score 9     Pain Loc  Pain Edu?      Excl. in [place]?    No data found.  Updated Vital Signs BP 118/73   Pulse 65   Temp 98.4 F (36.9 C)   Resp 18   SpO2 93%   Visual Acuity Right Eye Distance:   Left Eye Distance:   Bilateral Distance:    Right Eye Near:   Left Eye Near:    Bilateral Near:     Physical Exam Constitutional:      Appearance: He is obese. He is not ill-appearing, toxic-appearing or diaphoretic.  HENT:     Head: Normocephalic and atraumatic.  Eyes:     Extraocular Movements: Extraocular movements intact.     Pupils: Pupils are equal, round, and reactive to light.  Cardiovascular:     Rate and Rhythm: Normal rate and regular rhythm.  Pulmonary:     Effort: Pulmonary effort is normal.     Breath sounds: Normal breath sounds and air entry.  Musculoskeletal:     Cervical back: Normal range of motion and neck supple.     Lumbar back: Tenderness present. No swelling or edema. Normal range of motion. Positive right straight leg raise test and positive left straight leg raise test.  Neurological:      General: No focal deficit present.     Mental Status: He is alert and oriented to person, place, and time.     GCS: GCS eye subscore is 4. GCS verbal subscore is 5. GCS motor subscore is 6.     Motor: Motor function is intact.     Coordination: Coordination is intact.     Gait: Gait is intact.  Psychiatric:        Attention and Perception: Attention normal.        Mood and Affect: Mood normal.        Speech: Speech normal.        Behavior: Behavior normal.      UC Treatments / Results  Labs (all labs ordered are listed, but only abnormal results are displayed) Labs Reviewed - No data to display  EKG   Radiology No results found.  Procedures Procedures (including critical care time)  Medications Ordered in UC Medications  dexamethasone (DECADRON) injection 10 mg (10 mg Intramuscular Given 03/07/22 1825)  ketorolac (TORADOL) 30 MG/ML injection 15 mg (15 mg Intramuscular Given 03/07/22 1825)    Initial Impression / Assessment and Plan / UC Course  I have reviewed the triage vital signs and the nursing notes.  Pertinent labs & imaging results that were available during my care of the patient were reviewed by me and considered in my medical decision making (see chart for details).    Treatment plan reviewed and discussed. Patient has not had any recent evaluation of chronic back pain, advised given significance of pain, recommend follow-up with [facility] to obtain updated work-up and diagnostic imaging if warranted pertaining to back pain. Verbalized understanding and agreement with plan.  Final Clinical Impressions(s) / UC Diagnoses   Final diagnoses:  Acute right-sided low back pain with right-sided sciatica   Discharge Instructions   None    ED Prescriptions     Medication Sig Dispense Auth. Provider   predniSONE (DELTASONE) 20 MG tablet Take 2 tablets (40 mg total) by mouth daily with breakfast. 10 tablet Scot Jun, FNP   tiZANidine (ZANAFLEX) 4 MG tablet  Take 1 tablet (4 mg total) by mouth every 12 (twelve) hours as needed for muscle spasms. 30 tablet  Scot Jun, FNP      PDMP not reviewed this encounter.   Scot Jun, FNP 03/08/22 [phone]

## 2022-03-07 NOTE — ED Triage Notes (Signed)
Pt reports back pain flare up. Pt presents to day wearing a back brace. Pt has been taking OTC for pain and is requesting a injection.

## 2022-03-15 ENCOUNTER — Other Ambulatory Visit: Payer: Self-pay | Admitting: Family Medicine

## 2023-05-24 ENCOUNTER — Ambulatory Visit
Admission: RE | Admit: 2023-05-24 | Discharge: 2023-05-24 | Disposition: A | Discharge status: Home / Self Care | Payer: No Typology Code available for payment source | Source: Ambulatory Visit | Attending: Emergency Medicine | Admitting: Emergency Medicine

## 2023-05-24 VITALS — BP 125/73 | HR 69 | Temp 99.0°F | Resp 18

## 2023-05-24 DIAGNOSIS — M545 Low back pain, unspecified: Secondary | ICD-10-CM

## 2023-05-24 DIAGNOSIS — J069 Acute upper respiratory infection, unspecified: Secondary | ICD-10-CM | POA: Diagnosis not present

## 2023-05-24 MED ORDER — AMOXICILLIN-POT CLAVULANATE 875-125 MG PO TABS: 1.0000 | ORAL_TABLET | Freq: Two times a day (BID) | ORAL | 0 refills | Status: DC

## 2023-05-24 MED ORDER — CYCLOBENZAPRINE HCL 5 MG PO TABS: 5.0000 mg | ORAL_TABLET | Freq: Three times a day (TID) | ORAL | 0 refills | Status: DC | PRN

## 2023-05-24 MED ORDER — KETOROLAC TROMETHAMINE 30 MG/ML IJ SOLN
30.0000 mg | Freq: Once | INTRAMUSCULAR | Status: AC
  Administered 2023-05-24: 30 mg via INTRAMUSCULAR

## 2023-05-24 NOTE — ED Triage Notes (Signed)
Pt presents with a cough, runny nose, chills, bodyaches and fatigue x 5 days. Pt traveled to to [place] last week . He took a covid/flu test yesterday and it was negative.

## 2023-05-24 NOTE — ED Provider Notes (Addendum)
Chris Romero    CSN: [id number] Arrival date & time: 05/24/23  1200      History   Chief Complaint Chief Complaint  Patient presents with   Nasal Congestion    Entered by patient   Fatigue   Chills   Headache    HPI Chris Romero is a 62 y.o. male.   Patient presents for evaluation of nasal congestion, rhinorrhea, intermittent generalized headaches, intermittent sore throat and a nonproductive cough present for 5 days.  Has had worsening bilateral lower back pain over the past 2 to 3 weeks, exacerbated by traveling due to frequent sitting and standing on trains and planes.  Has attempted use of pseudoephedrine which has helped to break up congestion within chest but symptoms have persisted.  Coughing worse at nighttime.  Has recently used prednisone course for back which has provided minimal relief.  Back temporal bleed managed with Aleve.  Denies numbness or tingling, urinary or bowel incontinence.  Recent travel from [place].  Denies Shortness of breath or wheeze.  Past Medical History:  Diagnosis Date   Back pain    Rectal bleeding     Patient Active Problem List   Diagnosis Date Noted   Blood in stool    Benign neoplasm of transverse colon    Polyp of sigmoid colon     Past Surgical History:  Procedure Laterality Date   CHOLECYSTECTOMY     CIRCUMCISION     age 69   COLONOSCOPY WITH PROPOFOL N/A 07/26/2016   Procedure: COLONOSCOPY WITH PROPOFOL;  Surgeon: Midge Minium, MD;  Location: [facility];  Service: Endoscopy;  Laterality: N/A;   POLYPECTOMY N/A 07/26/2016   Procedure: POLYPECTOMY;  Surgeon: Midge Minium, MD;  Location: [facility];  Service: Endoscopy;  Laterality: N/A;       Home Medications    Prior to Admission medications   Medication Sig Start Date End Date Taking? Authorizing Provider  amoxicillin-clavulanate (AUGMENTIN) 875-125 MG tablet Take 1 tablet by mouth every 12 (twelve) hours. 05/24/23  Yes Dima Mini R, NP   cyclobenzaprine (FLEXERIL) 5 MG tablet Take 1 tablet (5 mg total) by mouth 3 (three) times daily as needed for muscle spasms. 05/24/23  Yes Laurel Harnden, Elita Boone, NP  polyethylene glycol (GOLYTELY) 236 g solution Drink one 8 oz glass every 20 mins until stools are clear 07/04/16   Midge Minium, MD  predniSONE (DELTASONE) 20 MG tablet Take 2 tablets (40 mg total) by mouth daily with breakfast. 03/07/22   Bing Neighbors, NP  tiZANidine (ZANAFLEX) 4 MG tablet Take 1 tablet (4 mg total) by mouth every 12 (twelve) hours as needed for muscle spasms. 03/07/22   Bing Neighbors, NP    Family History Family History  Problem Relation Age of Onset   Stroke Mother    Cataracts Father    Dementia Father     Social History Social History   Tobacco Use   Smoking status: Every Day    Current packs/day: 0.50    Average packs/day: 0.5 packs/day for 30.0 years (15.0 ttl pk-yrs)    Types: Cigarettes   Smokeless tobacco: Never   Tobacco comments:    off and on  Vaping Use   Vaping status: Every Day  Substance Use Topics   Alcohol use: Yes    Comment: 2x/month   Drug use: No     Allergies   Patient has no known allergies.   Review of Systems Review of Systems   Physical  Exam Triage Vital Signs ED Triage Vitals  Encounter Vitals Group     BP 05/24/23 1215 125/73     Systolic BP Percentile --      Diastolic BP Percentile --      Pulse Rate 05/24/23 1215 69     Resp 05/24/23 1215 18     Temp 05/24/23 1215 99 F (37.2 C)     Temp Source 05/24/23 1215 Oral     SpO2 05/24/23 1215 95 %     Weight --      Height --      Head Circumference --      Peak Flow --      Pain Score 05/24/23 1213 7     Pain Loc --      Pain Education --      Exclude from Growth Chart --    No data found.  Updated Vital Signs BP 125/73 (BP Location: Left Arm)   Pulse 69   Temp 99 F (37.2 C) (Oral)   Resp 18   SpO2 95%   Visual Acuity Right Eye Distance:   Left Eye Distance:   Bilateral  Distance:    Right Eye Near:   Left Eye Near:    Bilateral Near:     Physical Exam Constitutional:      Appearance: Normal appearance.  HENT:     Head: Normocephalic.     Right Ear: Tympanic membrane, ear canal and external ear normal.     Left Ear: Tympanic membrane, ear canal and external ear normal.     Nose: Congestion present. No rhinorrhea.     Mouth/Throat:     Mouth: Mucous membranes are moist.     Pharynx: Oropharynx is clear. Posterior oropharyngeal erythema present. No oropharyngeal exudate.  Eyes:     Extraocular Movements: Extraocular movements intact.  Cardiovascular:     Rate and Rhythm: Normal rate and regular rhythm.     Pulses: Normal pulses.     Heart sounds: Normal heart sounds.  Pulmonary:     Effort: Pulmonary effort is normal.     Breath sounds: Normal breath sounds.  Musculoskeletal:     Comments: Tenderness along the bilateral lumbar region without ecchymosis swelling or deformity, able to twist turn and bend, able to sit erect without complication  Skin:    General: Skin is warm and dry.  Neurological:     Mental Status: He is alert and oriented to person, place, and time. Mental status is at baseline.      UC Treatments / Results  Labs (all labs ordered are listed, but only abnormal results are displayed) Labs Reviewed - No data to display  EKG   Radiology No results found.  Procedures Procedures (including critical care time)  Medications Ordered in UC Medications  ketorolac (TORADOL) 30 MG/ML injection 30 mg (has no administration in time range)    Initial Impression / Assessment and Plan / UC Course  I have reviewed the triage vital signs and the nursing notes.  Pertinent labs & imaging results that were available during my care of the patient were reviewed by me and considered in my medical decision making (see chart for details).  Acute URI,  lumbar back pain  Patient is in no signs of distress nor toxic appearing.  Vital  signs are stable.  Low suspicion for pneumonia, pneumothorax or bronchitis and therefore will defer imaging.  Testing deferred due to timeline of illness.  Etiology most likely viral, discussed with  patient however as multiple members of travel with him who have similar symptoms have received antibiotic, requesting therefore azithromycin sent to pharmacy, discussed that this will most likely be protective more than a treatment.  Additionally prescribed Flexeril for management of back pain and neck and given injection of Toradol in office.May use additional over-the-counter medications as needed for supportive care.  May follow-up with urgent care as needed if symptoms persist or worsen.  Final Clinical Impressions(s) / UC Diagnoses   Final diagnoses:  Acute URI     Discharge Instructions      Bugnk semptomlar?n?z byk ihtimalle bir virsten kaynaklan?yor ve zamanla giderek iyile?melidir, gerekten bir iyile?me grmeye ba?laman?z 7 ila 10 gn srebilir, ancak i?ler dzelecektir, byk ihtimalle seyahat s?ras?nda kap?lm??t?r  Her sabah ve her ak?am 7 gn boyunca Augmentin kullanmaya ba?lay?n, bu bir antibiyotiktir, bakterilere kar?? koruma sa?lar ve semptomlar?n zatrre gibi daha ciddi solunum yolu rahats?zl?klar?na ilerlemesini nler  S?rt a?r?n?z iin bugn ofiste iltihab? azaltmaya ve a?r?ya yard?mc? olan Toradol enjeksiyonu yap?ld?, ideal olarak 30 dakika ila bir saat iinde biraz rahatlama greceksiniz, yar?ndan itibaren gnn geri kalan?nda Tylenol alabilirsiniz Aleve kullanmaya devam edebilirsiniz  [place] rahatl?k iin gerekti?inde her 8 saatte bir kas gev?etici kullanabilirsiniz, bunun sizi uykulu hissettirece?ini unutmay?n  Etkilenen blgeye 10 ila 15 dakikal?k aral?klarla ?s? Geologist, engineering, tolere edildi?i ?ekilde masaj yapabilir ve esnetebilirsiniz  ksrk iin: 1/2 ila 1 ay ka???? bal (bal? suyla seyreltebilir veya ba?ka bir s?v?). ksrk iin guaifenesin ve dekstrometorfan da  [employer]. G?s t?kan?kl??? ve ksrk iin bir nemlendirici [employer]. Nemlendiriciniz yoksa, s?cak du? akarken banyoda oturabilirsiniz.  Bo?az a?r?s? iin: ?l?k tuzlu su gargaralar?, cepacol pastilleri, bo?az spreyi, limonlu/ball? ?l?k ay veya su, dondurmalar veya buz veya bo?az rahats?zl??? iin reetesiz sat?lan so?uk alg?nl??? giderici ilalar deneyin. T?kan?kl?k iin: Zyrtec, Claritin gibi gnlk bir antihistaminik ve pseudoephedrine gibi oral bir dekonjestan al?n. Ayr?ca her gn her burun deli?ine 1-2 sprey Flonase kullanabilirsiniz. Susuz kalmamak nemlidir: bo?az?n?z? nemli tutmak ve tahri?i/rahats?zl??? daha da gidermek iin bol s?v? iin (su, gatorade/powerade/pedialyte, meyve sular? veya aylar).   Your symptoms today are most likely being caused by a virus and should steadily improve in time it can take up to 7 to 10 days before you truly start to see a turnaround however things will get better, most likely picked up during travel  Begin Augmentin every morning and every evening for 7 days which is an antibiotic, will provide coverage for any bacteria and prevent symptoms from progressing to more serious respiratory condition such as pneumonia  For your backache you have been given an injection of Toradol today here in office which helps to reduce inflammation and helps with pain ideally will see some relief in 30 minutes to an hour, may take Tylenol for the remainder of the day, starting tomorrow May continue use of Aleve  May use muscle relaxant every 8 hours as needed for additional comfort, be mindful this will make you feel sleepy  May use heat over the affected area in 10 to 15-minute intervals, may massage and stretch as tolerated    For cough: honey 1/2 to 1 teaspoon (you can dilute the honey in water or another fluid).  You can also use guaifenesin and dextromethorphan for cough. You can use a humidifier for chest congestion and cough.  If you don't have a  humidifier, you can sit in the bathroom with the hot shower running.      For sore throat: try warm salt water gargles, cepacol lozenges, throat spray, warm tea or water with lemon/honey,  popsicles or ice, or OTC cold relief medicine for throat discomfort.   For congestion: take a daily anti-histamine like Zyrtec, Claritin, and a oral decongestant, such as pseudoephedrine.  You can also use Flonase 1-2 sprays in each nostril daily.   It is important to stay hydrated: drink plenty of fluids (water, gatorade/powerade/pedialyte, juices, or teas) to keep your throat moisturized and help further relieve irritation/discomfort.     ED Prescriptions     Medication Sig Dispense Auth. Provider   cyclobenzaprine (FLEXERIL) 5 MG tablet Take 1 tablet (5 mg total) by mouth 3 (three) times daily as needed for muscle spasms. 30 tablet Salli Quarry R, NP   amoxicillin-clavulanate (AUGMENTIN) 875-125 MG tablet Take 1 tablet by mouth every 12 (twelve) hours. 14 tablet Sydell Prowell, Elita Boone, NP      PDMP not reviewed this encounter.   Valinda Hoar, NP 05/24/23 1245    Valinda Hoar, [place] 05/24/23 1246

## 2023-05-24 NOTE — Discharge Instructions (Addendum)
Bugnk semptomlar?n?z byk ihtimalle bir virsten kaynaklan?yor ve zamanla giderek iyile?melidir, gerekten bir iyile?me grmeye ba?laman?z 7 ila 10 gn srebilir, ancak i?ler dzelecektir, byk ihtimalle seyahat s?ras?nda kap?lm??t?r  Her sabah ve her ak?am 7 gn boyunca Augmentin kullanmaya ba?lay?n, bu bir antibiyotiktir, bakterilere kar?? koruma sa?lar ve semptomlar?n zatrre gibi daha ciddi solunum yolu rahats?zl?klar?na ilerlemesini nler  S?rt a?r?n?z iin bugn ofiste iltihab? azaltmaya ve a?r?ya yard?mc? olan Toradol enjeksiyonu yap?ld?, ideal olarak 30 dakika ila bir saat iinde biraz rahatlama greceksiniz, yar?ndan itibaren gnn geri kalan?nda Tylenol alabilirsiniz Aleve kullanmaya devam edebilirsiniz  [place] rahatl?k iin gerekti?inde her 8 saatte bir kas gev?etici kullanabilirsiniz, bunun sizi uykulu hissettirece?ini unutmay?n  Etkilenen blgeye 10 ila 15 dakikal?k aral?klarla ?s? Geologist, engineering, tolere edildi?i ?ekilde masaj yapabilir ve esnetebilirsiniz  ksrk iin: 1/2 ila 1 ay ka???? bal (bal? suyla seyreltebilir veya ba?ka bir s?v?). ksrk iin guaifenesin ve dekstrometorfan da [employer]. G?s t?kan?kl??? ve ksrk iin bir nemlendirici [employer]. Nemlendiriciniz yoksa, s?cak du? akarken banyoda oturabilirsiniz.  Bo?az a?r?s? iin: ?l?k tuzlu su gargaralar?, cepacol pastilleri, bo?az spreyi, limonlu/ball? ?l?k ay veya su, dondurmalar veya buz veya bo?az rahats?zl??? iin reetesiz sat?lan so?uk alg?nl??? giderici ilalar deneyin. T?kan?kl?k iin: Zyrtec, Claritin gibi gnlk bir antihistaminik ve pseudoephedrine gibi oral bir dekonjestan al?n. Ayr?ca her gn her burun deli?ine 1-2 sprey Flonase kullanabilirsiniz. Susuz kalmamak nemlidir: bo?az?n?z? nemli tutmak ve tahri?i/rahats?zl??? daha da gidermek iin bol s?v? iin (su, gatorade/powerade/pedialyte, meyve sular? veya aylar).   Your symptoms today are most likely being caused by a virus and should  steadily improve in time it can take up to 7 to 10 days before you truly start to see a turnaround however things will get better, most likely picked up during travel  Begin Augmentin every morning and every evening for 7 days which is an antibiotic, will provide coverage for any bacteria and prevent symptoms from progressing to more serious respiratory condition such as pneumonia  For your backache you have been given an injection of Toradol today here in office which helps to reduce inflammation and helps with pain ideally will see some relief in 30 minutes to an hour, may take Tylenol for the remainder of the day, starting tomorrow May continue use of Aleve  May use muscle relaxant every 8 hours as needed for additional comfort, be mindful this will make you feel sleepy  May use heat over the affected area in 10 to 15-minute intervals, may massage and stretch as tolerated    For cough: honey 1/2 to 1 teaspoon (you can dilute the honey in water or another fluid).  You can also use guaifenesin and dextromethorphan for cough. You can use a humidifier for chest congestion and cough.  If you don't have a humidifier, you can sit in the bathroom with the hot shower running.      For sore throat: try warm salt water gargles, cepacol lozenges, throat spray, warm tea or water with lemon/honey, popsicles or ice, or OTC cold relief medicine for throat discomfort.   For congestion: take a daily anti-histamine like Zyrtec, Claritin, and a oral decongestant, such as pseudoephedrine.  You can also use Flonase 1-2 sprays in each nostril daily.   It is important to stay hydrated: drink plenty of fluids (water, gatorade/powerade/pedialyte, juices, or teas) to keep your throat moisturized and help further relieve irritation/discomfort.

## 2023-11-09 ENCOUNTER — Other Ambulatory Visit: Payer: Self-pay

## 2023-11-09 ENCOUNTER — Encounter: Payer: Self-pay | Admitting: Emergency Medicine

## 2023-11-09 ENCOUNTER — Ambulatory Visit
Admission: EM | Admit: 2023-11-09 | Discharge: 2023-11-09 | Disposition: A | Discharge status: Home / Self Care | Attending: Emergency Medicine | Admitting: Emergency Medicine

## 2023-11-09 DIAGNOSIS — M546 Pain in thoracic spine: Secondary | ICD-10-CM

## 2023-11-09 MED ORDER — CYCLOBENZAPRINE HCL 5 MG PO TABS: 5.0000 mg | ORAL_TABLET | Freq: Two times a day (BID) | ORAL | 0 refills | Status: DC | PRN

## 2023-11-09 MED ORDER — KETOROLAC TROMETHAMINE 30 MG/ML IJ SOLN
30.0000 mg | Freq: Once | INTRAMUSCULAR | Status: AC
  Administered 2023-11-09: 30 mg via INTRAMUSCULAR

## 2023-11-09 NOTE — ED Provider Notes (Signed)
 Chris Romero    CSN: [id number] Arrival date & time: 11/09/23  1322      History   Chief Complaint Chief Complaint  Patient presents with   Back Pain    HPI Chris Romero is a 63 y.o. male.   Patient presents for evaluation of right and left sided back pain present for 3 to 4 days, progressively worsening today.  Symptoms began approximately 2 days after going to the gym, believes to have pulled a muscle.  Has been taking ibuprofen 400 mg which has been helpful.  While walking around today pain intensified causing shortness of breath and diaphoresis, has resolved.  Pain described as sharp and does not radiate.  Denies numbness or tingling, urinary or bowel incontinence, direct injury or trauma.  Past Medical History:  Diagnosis Date   Back pain    Rectal bleeding     Patient Active Problem List   Diagnosis Date Noted   Blood in stool    Benign neoplasm of transverse colon    Polyp of sigmoid colon     Past Surgical History:  Procedure Laterality Date   CHOLECYSTECTOMY     CIRCUMCISION     age 50   COLONOSCOPY WITH PROPOFOL N/A 07/26/2016   Procedure: COLONOSCOPY WITH PROPOFOL;  Surgeon: Marnee Sink, MD;  Location: [facility];  Service: Endoscopy;  Laterality: N/A;   POLYPECTOMY N/A 07/26/2016   Procedure: POLYPECTOMY;  Surgeon: Marnee Sink, MD;  Location: [facility];  Service: Endoscopy;  Laterality: N/A;       Home Medications    Prior to Admission medications   Medication Sig Start Date End Date Taking? Authorizing Provider  cyclobenzaprine (FLEXERIL) 5 MG tablet Take 1 tablet (5 mg total) by mouth 2 (two) times daily as needed for muscle spasms. 11/09/23  Yes Ermal Brzozowski R, NP  amoxicillin-clavulanate (AUGMENTIN) 875-125 MG tablet Take 1 tablet by mouth every 12 (twelve) hours. 05/24/23   Reena Canning, NP  polyethylene glycol (GOLYTELY) 236 g solution Drink one 8 oz glass every 20 mins until stools are clear 07/04/16   Marnee Sink, MD  predniSONE (DELTASONE) 20 MG tablet Take 2 tablets (40 mg total) by mouth daily with breakfast. 03/07/22   Buena Carmine, NP  tiZANidine (ZANAFLEX) 4 MG tablet Take 1 tablet (4 mg total) by mouth every 12 (twelve) hours as needed for muscle spasms. 03/07/22   Buena Carmine, NP    Family History Family History  Problem Relation Age of Onset   Stroke Mother    Cataracts Father    Dementia Father     Social History Social History   Tobacco Use   Smoking status: Every Day    Current packs/day: 0.50    Average packs/day: 0.5 packs/day for 30.0 years (15.0 ttl pk-yrs)    Types: Cigarettes   Smokeless tobacco: Never   Tobacco comments:    off and on  Vaping Use   Vaping status: Every Day  Substance Use Topics   Alcohol use: Yes    Comment: 2x/month   Drug use: No     Allergies   Patient has no known allergies.   Review of Systems Review of Systems   Physical Exam Triage Vital Signs ED Triage Vitals [11/09/23 1344]  Encounter Vitals Group     BP (!) 150/76     Systolic BP Percentile      Diastolic BP Percentile      Pulse Rate 63  Resp 20     Temp 98.3 F (36.8 C)     Temp Source Oral     SpO2 95 %     Weight      Height      Head Circumference      Peak Flow      Pain Score 8     Pain Loc      Pain Education      Exclude from Growth Chart    No data found.  Updated Vital Signs BP (!) 150/76 (BP Location: Left Arm)   Pulse 63   Temp 98.3 F (36.8 C) (Oral)   Resp 20   SpO2 95%   Visual Acuity Right Eye Distance:   Left Eye Distance:   Bilateral Distance:    Right Eye Near:   Left Eye Near:    Bilateral Near:     Physical Exam Constitutional:      Appearance: Normal appearance.  Eyes:     Extraocular Movements: Extraocular movements intact.  Pulmonary:     Effort: Pulmonary effort is normal.  Musculoskeletal:     Comments: Tenderness present to the right and left thoracic region adjacent to the spine without spinal  tenderness, able to twist turn and bend but pain elicited with all movement, negative straight leg test  Neurological:     Mental Status: He is alert and oriented to person, place, and time. Mental status is at baseline.      UC Treatments / Results  Labs (all labs ordered are listed, but only abnormal results are displayed) Labs Reviewed - No data to display  EKG   Radiology No results found.  Procedures Procedures (including critical care time)  Medications Ordered in UC Medications  ketorolac (TORADOL) 30 MG/ML injection 30 mg (30 mg Intramuscular Given 11/09/23 1358)    Initial Impression / Assessment and Plan / UC Course  I have reviewed the triage vital signs and the nursing notes.  Pertinent labs & imaging results that were available during my care of the patient were reviewed by me and considered in my medical decision making (see chart for details).  Acute bilateral thoracic back pain  Etiology most likely muscular low suspicion for spinal involvement therefore imaging deferred at this time, denies new injury, Toradol IM given, would like to hold off on use of steroids, additionally prescribed muscle relaxant, recommended supportive care through [facility], heat massage stretching with activity as tolerated and walker referral given orthopedics  All interpretation done by family Final Clinical Impressions(s) / UC Diagnoses   Final diagnoses:  Acute bilateral thoracic back pain     Discharge Instructions      A?r?n?z byk ihtimalle kaslar?n?z?n tahri? olmas?ndan kaynaklanmaktad?r.  Enflamasyonu ve a?r?y? azaltmak iin size Toradol enjeksiyonu yap?ld? ve ideal olarak bir saat iinde biraz rahatlama grmeye ba?layacaks?n?z.  Yar?ndan itibaren a?r?n?z iin ihtiya duydu?unuzda ibuprofen'e devam edebilir, ayr?ca Tylenol veya herhangi bir topikal ilac? da alabilirsiniz.  Daha fazla rahatl?k sa?lamak iin gnde iki kez kas gev?etici kullanabilirsiniz, e?er [place]  geliyorsa yatmadan nce kullanabilirsiniz.  Ek konfor iin ihtiya halinde 15 dakikal?k aral?klarla ?s?tma yast???n? kullanabilir veya etkilenen blgeye 10-15 dakika buz uygulayarak rahatl?k bulabilirsiniz.  Kaslar? daha da gev?etmek iin etkilenen blgeyi tolere edildi?i srece her gn 10 dakika boyunca germeye ba?lay?n.   Yatarken destek olmas? iin dizlerin alt?na ve aras?na yast?k koyun.  Sert yatakta yast?ks?z uyumay? deneyebilir   ?yi bir duru? uygulay?n: ba? geride, omuzlar geride, g?s nde, pelvis s?rtta ve a??rl?k her iki baca?a e?it  olarak da??t?ls?n  nerilen tedaviden sonra a?r? devam ederse veya yeniden ortaya ?karsa, de?erlendirme iin ortopedi uzman?na ba?vurmak faydal? olabilir, bu doktor [employer] uzmand?r ve grntleme, ilalar veya fizik tedavi gibi ancak bunlarla s?n?rl? olmamak zere seeneklerle semptomlar?n?z? uzun vadede ynetebilir.   Your pain is most likely caused by irritation to the muscles.  You have been given an injection of Toradol to help reduce inflammation and pain and ideally will start to see some relief in an hour  Starting tomorrow you may resume ibuprofen as needed for pain, may take Tylenol or any topical medicines additionally  You may use muscle relaxant twice a daily for additional comfort, if making you drowsy you may use at bedtime  You may use heating pad in 15 minute intervals as needed for additional comfort, or you may find comfort in using ice in 10-15 minutes over affected area  Begin stretching affected area daily for 10 minutes as tolerated to further loosen muscles   When lying down place pillow underneath and between knees for support  Can try sleeping without pillow on firm mattress   Practice good posture: head back, shoulders back, chest forward, pelvis back and weight distributed evenly on both legs  If pain persist after recommended treatment or reoccurs if may be beneficial to follow up with orthopedic  specialist for evaluation, this doctor specializes in the bones and can manage your symptoms long-term with options such as but not limited to imaging, medications or physical therapy      ED Prescriptions     Medication Sig Dispense Auth. Provider   cyclobenzaprine (FLEXERIL) 5 MG tablet Take 1 tablet (5 mg total) by mouth 2 (two) times daily as needed for muscle spasms. 20 tablet Marne Meline R, NP      PDMP not reviewed this encounter.   Reena Canning, NP 11/09/23 1442

## 2023-11-09 NOTE — ED Triage Notes (Signed)
 Patient presents to [facility] for evaluation of left and right flank pain.  He went to the gym a week ago, began to have the pain 2 days after he worked out for the first time in a while, and is sure he has a pulled muscle in his back.  Has been taking 400mg  of ibuprofen with some relief, but it comes back, and it seems to have gotten more intense today.  Patient was walking around the store with his wife prior to arrival, and the pain was intense enough to cause shortness of breath and diaphoresis.  Describes it as sharp.  Patient was offered, but refused an interpreter

## 2023-11-09 NOTE — Discharge Instructions (Signed)
 A?r?n?z byk ihtimalle kaslar?n?z?n tahri? olmas?ndan kaynaklanmaktad?r.  Enflamasyonu ve a?r?y? azaltmak iin size Toradol enjeksiyonu yap?ld? ve ideal olarak bir saat iinde biraz rahatlama grmeye ba?layacaks?n?z.  Yar?ndan itibaren a?r?n?z iin ihtiya duydu?unuzda ibuprofen'e devam edebilir, ayr?ca Tylenol veya herhangi bir topikal ilac? da alabilirsiniz.  Daha fazla rahatl?k sa?lamak iin gnde iki kez kas gev?etici kullanabilirsiniz, e?er [place] geliyorsa yatmadan nce kullanabilirsiniz.  Ek konfor iin ihtiya halinde 15 dakikal?k aral?klarla ?s?tma yast???n? kullanabilir veya etkilenen blgeye 10-15 dakika buz uygulayarak rahatl?k bulabilirsiniz.  Kaslar? daha da gev?etmek iin etkilenen blgeyi tolere edildi?i srece her gn 10 dakika boyunca germeye ba?lay?n.   Yatarken destek olmas? iin dizlerin alt?na ve aras?na yast?k koyun.  Sert yatakta yast?ks?z uyumay? deneyebilir   ?yi bir duru? uygulay?n: ba? geride, omuzlar geride, g?s nde, pelvis s?rtta ve a??rl?k her iki baca?a e?it olarak da??t?ls?n  nerilen tedaviden sonra a?r? devam ederse veya yeniden ortaya ?karsa, de?erlendirme iin ortopedi uzman?na ba?vurmak faydal? olabilir, bu doktor [employer] uzmand?r ve grntleme, ilalar veya fizik tedavi gibi ancak bunlarla s?n?rl? olmamak zere seeneklerle semptomlar?n?z? uzun vadede ynetebilir.   Your pain is most likely caused by irritation to the muscles.  You have been given an injection of Toradol to help reduce inflammation and pain and ideally will start to see some relief in an hour  Starting tomorrow you may resume ibuprofen as needed for pain, may take Tylenol or any topical medicines additionally  You may use muscle relaxant twice a daily for additional comfort, if making you drowsy you may use at bedtime  You may use heating pad in 15 minute intervals as needed for additional comfort, or you may find comfort in using ice in 10-15 minutes over affected  area  Begin stretching affected area daily for 10 minutes as tolerated to further loosen muscles   When lying down place pillow underneath and between knees for support  Can try sleeping without pillow on firm mattress   Practice good posture: head back, shoulders back, chest forward, pelvis back and weight distributed evenly on both legs  If pain persist after recommended treatment or reoccurs if may be beneficial to follow up with orthopedic specialist for evaluation, this doctor specializes in the bones and can manage your symptoms long-term with options such as but not limited to imaging, medications or physical therapy

## 2023-11-16 IMAGING — CR DG RIBS 2V*L*
1 series · 4 of 4 positions shown · non-contrast
Comparison: None Available.

CLINICAL DATA: Status post fall.  Left rib pain.

EXAM:
LEFT RIBS - 2 VIEW

[Series 1: dg ribs unilateral left · 0.14mm/px · 4 of 4 slices shown]
[im 1/4]
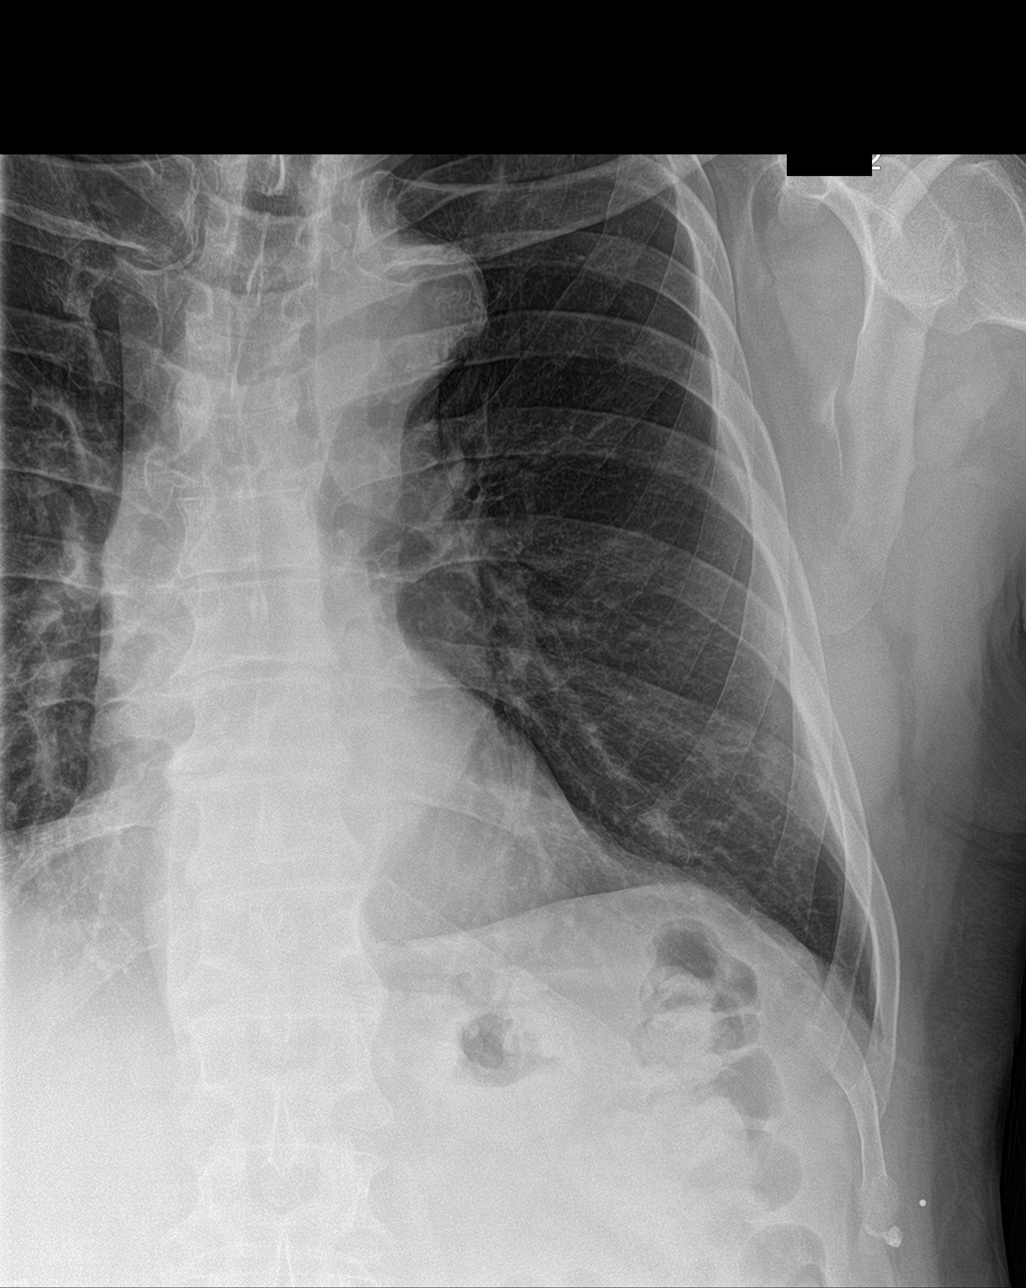
[im 2/4]
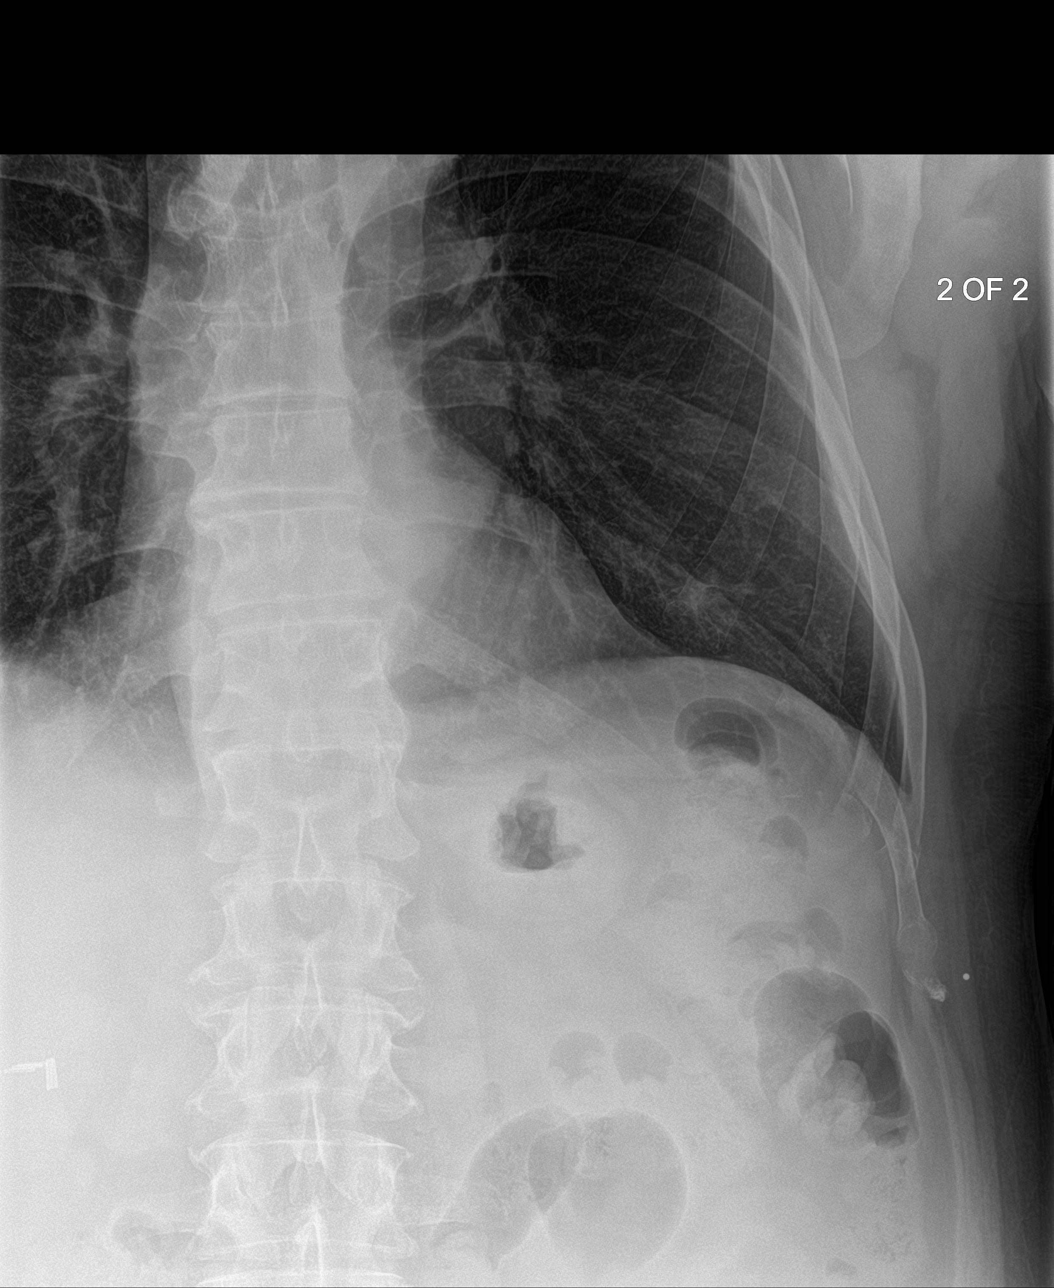
[im 3/4]
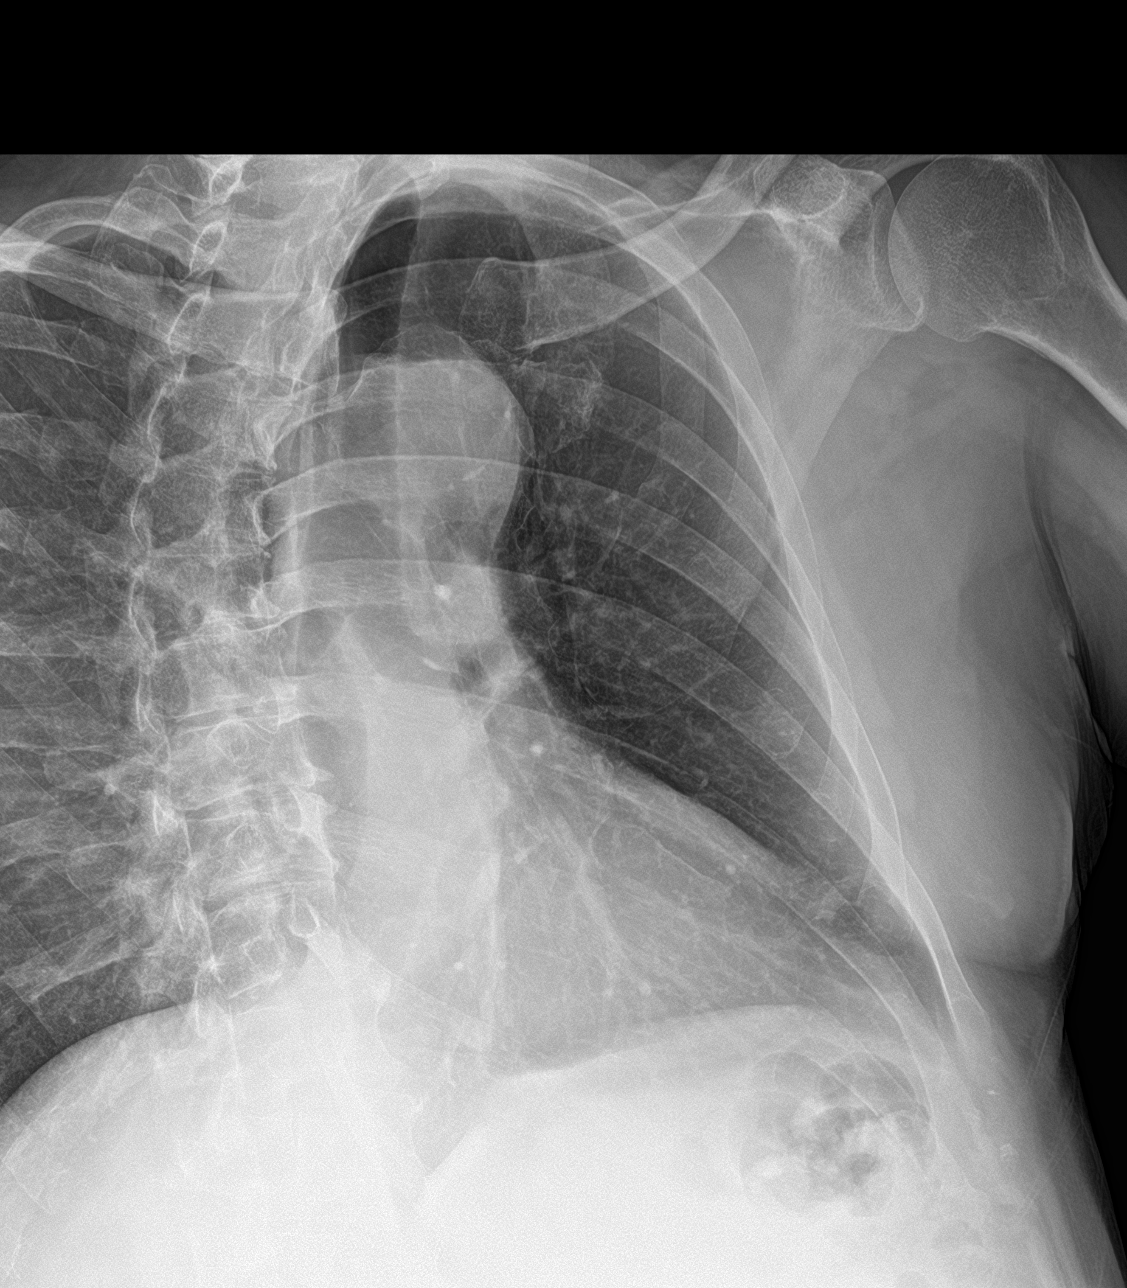
[im 4/4]
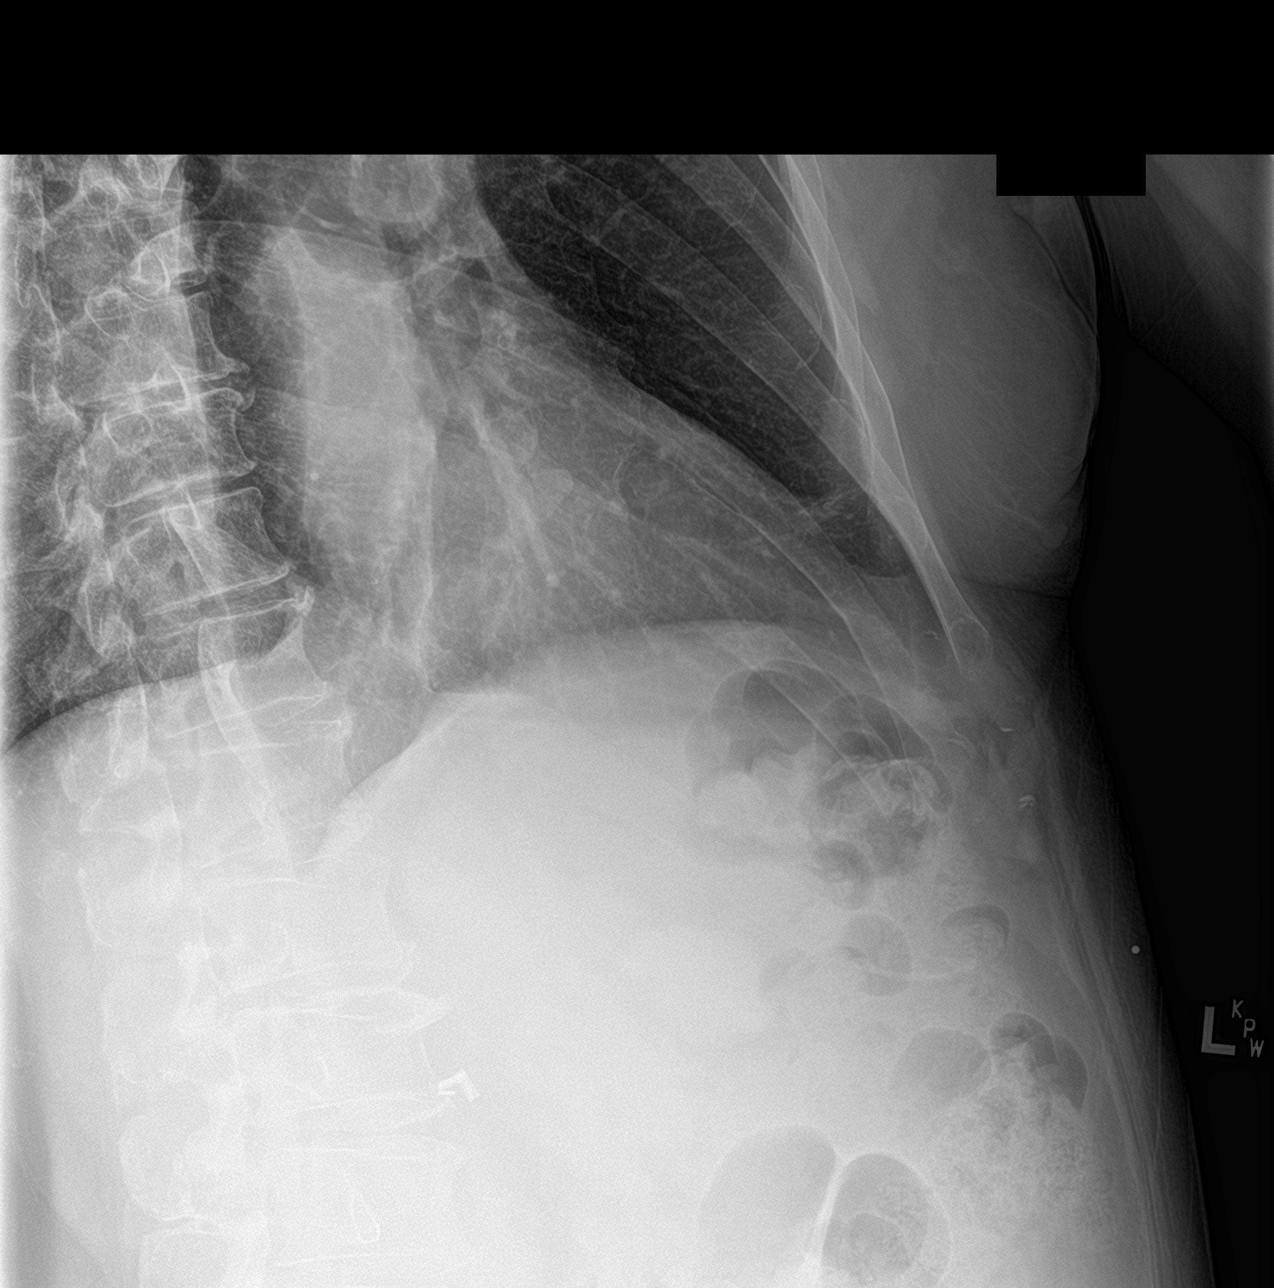

[4 of 4 positions shown; findings below may reference images not displayed]

FINDINGS: Nondisplaced fracture of the left anterolateral tenth rib. No other
fracture. No aggressive osseous lesion. No pleural reaction.
IMPRESSION: 1. Nondisplaced fracture of the left anterolateral tenth rib.

## 2024-07-20 ENCOUNTER — Encounter: Payer: Self-pay | Admitting: Internal Medicine

## 2024-07-20 ENCOUNTER — Telehealth: Payer: Self-pay | Admitting: Internal Medicine

## 2024-07-20 ENCOUNTER — Ambulatory Visit: Admitting: Internal Medicine

## 2024-07-20 VITALS — BP 116/70 | HR 61 | Temp 98.0°F | Resp 16 | Ht 72.0 in | Wt 286.0 lb

## 2024-07-20 DIAGNOSIS — F17200 Nicotine dependence, unspecified, uncomplicated: Secondary | ICD-10-CM

## 2024-07-20 DIAGNOSIS — G4733 Obstructive sleep apnea (adult) (pediatric): Secondary | ICD-10-CM

## 2024-07-20 NOTE — Progress Notes (Signed)
 [facility] [address] [place], [place] [address]  Pulmonary Sleep Medicine   Office Visit Note  Patient Name: Chris Romero DOB: 11/10/1960 MRN [id number]  Date of Service: 07/20/2024  Complaints/HPI: Patient has history of sleep disorder according to the wife. She states he has been holding his breaths at night. He also has snorting. She states he has lost a few pounds and snoring has improved but is still holding breath. He is sleepy during the daytime also. Patient states he has no headaches. He has noted some swelling around his ankles. He has some burning in his extremities. He has no diabetes. He has never fallen asleep when driving. He does smoke. Very little cigarettes but he does vape. He states he does socially drink.   Office Spirometry Results:     ROS  General: (-) fever, (-) chills, (-) night sweats, (-) weakness Skin: (-) rashes, (-) itching,. Eyes: (-) visual changes, (-) redness, (-) itching. Nose and Sinuses: (-) nasal stuffiness or itchiness, (-) postnasal drip, (-) nosebleeds, (-) sinus trouble. Mouth and Throat: (-) sore throat, (-) hoarseness. Neck: (-) swollen glands, (-) enlarged thyroid, (-) neck pain. Respiratory: - cough, (-) bloody sputum, + shortness of breath, - wheezing. Cardiovascular: - ankle swelling, (-) chest pain. Lymphatic: (-) lymph node enlargement. Neurologic: (-) numbness, (-) tingling. Psychiatric: (-) anxiety, (-) depression   Current Medication: Outpatient Encounter Medications as of 07/20/2024  Medication Sig   cyclobenzaprine  (FLEXERIL ) 5 MG tablet Take 1 tablet (5 mg total) by mouth 2 (two) times daily as needed for muscle spasms.   polyethylene glycol (GOLYTELY) 236 g solution Drink one 8 oz glass every 20 mins until stools are clear   predniSONE  (DELTASONE ) 20 MG tablet Take 2 tablets (40 mg total) by mouth daily with breakfast.   tiZANidine  (ZANAFLEX ) 4 MG tablet Take 1 tablet (4 mg total) by mouth every 12  (twelve) hours as needed for muscle spasms.   [DISCONTINUED] amoxicillin -clavulanate (AUGMENTIN ) 875-125 MG tablet Take 1 tablet by mouth every 12 (twelve) hours.   No facility-administered encounter medications on file as of 07/20/2024.    Surgical History: Past Surgical History:  Procedure Laterality Date   CHOLECYSTECTOMY     CIRCUMCISION     age 63   COLONOSCOPY WITH PROPOFOL  N/A 07/26/2016   Procedure: COLONOSCOPY WITH PROPOFOL ;  Surgeon: Rogelia Copping, MD;  Location: [facility];  Service: Endoscopy;  Laterality: N/A;   POLYPECTOMY N/A 07/26/2016   Procedure: POLYPECTOMY;  Surgeon: Rogelia Copping, MD;  Location: [facility];  Service: Endoscopy;  Laterality: N/A;    Medical History: Past Medical History:  Diagnosis Date   Back pain    Rectal bleeding     Family History: Family History  Problem Relation Age of Onset   Stroke Mother    Cataracts Father    Dementia Father     Social History: Social History   Socioeconomic History   Marital status: Married    Spouse name: Not on file   Number of children: Not on file   Years of education: Not on file   Highest education level: Not on file  Occupational History   Not on file  Tobacco Use   Smoking status: Every Day    Current packs/day: 0.50    Average packs/day: 0.5 packs/day for 30.0 years (15.0 ttl pk-yrs)    Types: Cigarettes   Smokeless tobacco: Never   Tobacco comments:    2 cigarettes daily  Vaping Use  Vaping status: Every Day  Substance and Sexual Activity   Alcohol use: Yes    Comment: 2x/month   Drug use: No   Sexual activity: Not on file  Other Topics Concern   Not on file  Social History Narrative   ** Merged History Encounter **       Social Drivers of Health   Tobacco Use: High Risk (07/20/2024)   Patient History    Smoking Tobacco Use: Every Day    Smokeless Tobacco Use: Never    Passive Exposure: Not on file  Financial Resource Strain: Low Risk  (07/08/2023)    Received [facility]   Overall Financial Resource Strain (CARDIA)    Difficulty of Paying Living Expenses: Not hard at all  Food Insecurity: No Food Insecurity (07/08/2023)   Received from [facility]   Epic    Within the past 12 months, you worried that your food would run out before you got the money to buy more.: Never true    Within the past 12 months, the food you bought just didn't last and you didn't have money to get more.: Never true  Transportation Needs: No Transportation Needs (07/08/2023)   Received from [facility] - Transportation    In the past 12 months, has lack of transportation kept you from medical appointments or from getting medications?: No    Lack of Transportation (Non-Medical): No  Physical Activity: Not on file  Stress: Not on file  Social Connections: Not on file  Intimate Partner Violence: Not on file  Depression (PHQ2-9): Low Risk (07/20/2024)   Depression (PHQ2-9)    PHQ-2 Score: 0  Alcohol Screen: Low Risk (07/20/2024)   Alcohol Screen    Last Alcohol Screening Score (AUDIT): 2  Housing: Low Risk  (07/08/2023)   Received [facility]   Epic    In the last 12 months, was there a time when you were not able to pay the mortgage or rent on time?: No    In the past 12 months, how many times have you moved where you were living?: 0    At any time in the past 12 months, were you homeless or living in a shelter (including now)?: No  Utilities: Not At Risk (07/08/2023)   Received from [facility] Utilities    Threatened with loss of utilities: No  Health Literacy: Not on file    Vital Signs: Blood pressure 116/70, pulse 61, temperature 98 F (36.7 C), resp. rate 16, height 6' (1.829 m), weight 286 lb (129.7 kg), SpO2 96%.  Examination: General Appearance: The patient is well-developed, well-nourished, and in no distress. Skin: Gross  inspection of skin unremarkable. Head: normocephalic, no gross deformities. Eyes: no gross deformities noted. ENT: ears appear grossly normal no exudates. Neck: Supple. No thyromegaly. No LAD. Respiratory: no rhonchi noted. Cardiovascular: Normal S1 and S2 without murmur or rub. Extremities: No cyanosis. pulses are equal. Neurologic: Alert and oriented. No involuntary movements.  LABS: No results found for this or any previous visit (from the past 2160 hours).  Radiology: No results found.  No results found.  No results found.  Assessment and Plan: Patient Active Problem List   Diagnosis Date Noted   Blood in stool    Benign neoplasm of transverse colon    Polyp of sigmoid colon     Patient Name:  DOB:       Height:     Weight:  Office Name:         Referring Provider:  Today's Date:  Date:   STOP BANG RISK ASSESSMENT S (snore) Have you been told that you snore?     YES   T (tired) Are you often tired, fatigued, or sleepy during the day?   YES  O (obstruction) Do you stop breathing, choke, or gasp during sleep? YES   P (pressure) Do you have or are you being treated for high blood pressure? NO   B (BMI) Is your body index greater than 35 kg/m? YES   A (age) Are you 53 years old or older? YES   N (neck) Do you have a neck circumference greater than 16 inches?   YES   G (gender) Are you a male? YES   TOTAL STOP/BANG YES ANSWERS 7                                                                       For Office Use Only              Procedure Order Form    YES to 3+ Stop Bang questions OR two clinical symptoms - patient qualifies for WatchPAT (CPT 95800)               1. OSA (obstructive sleep apnea) (Primary) He has a prior history of OSA when he was in [place]. He states he is now ready to get another sleep study and go on treatment. Not able to use CPAP in the past  2. Obesity, morbid (HCC) Obesity Counseling: Had a lengthy discussion  regarding patients BMI and weight issues. Patient was instructed on portion control as well as increased activity. Also discussed caloric restrictions with trying to maintain intake less than 2000 Kcal. Discussions were made in accordance with the 5As of weight management. Simple actions such as not eating late and if able to, taking a walk is suggested.    3. Patient is an active smoker and uses Vape.  Needs to stop smoking and vape   General Counseling: I have discussed the findings of the evaluation and examination with Lynwood.  I have also discussed any further diagnostic evaluation thatmay be needed or ordered today. Harjit verbalizes understanding of the findings of todays visit. We also reviewed his medications today and discussed drug interactions and side effects including but not limited excessive drowsiness and altered mental states. We also discussed that there is always a risk not just to him but also people around him. he has been encouraged to call the office with any questions or concerns that should arise related to todays visit.  Orders Placed This Encounter  Procedures   PSG Sleep Study    Standing Status:   Future    Expiration Date:   07/20/2025    Where should this test be performed::   [facility]     Time spent: 77  I have personally obtained a history, examined the patient, evaluated laboratory and imaging results, formulated the assessment and plan and placed orders.    Elfreda DELENA Bathe, MD [facility]monary [facility] Sleep medicine

## 2024-07-20 NOTE — Telephone Encounter (Signed)
 SS order emailed to FG-Toni

## 2024-07-20 NOTE — Patient Instructions (Signed)

## 2024-08-20 ENCOUNTER — Ambulatory Visit
Admission: RE | Admit: 2024-08-20 | Discharge: 2024-08-20 | Disposition: A | Discharge status: Home / Self Care | Payer: Self-pay | Source: Ambulatory Visit | Attending: Emergency Medicine | Admitting: Emergency Medicine

## 2024-08-20 ENCOUNTER — Encounter (INDEPENDENT_AMBULATORY_CARE_PROVIDER_SITE_OTHER): Admitting: Internal Medicine

## 2024-08-20 VITALS — BP 109/66 | HR 63 | Temp 97.4°F | Resp 17

## 2024-08-20 DIAGNOSIS — M545 Low back pain, unspecified: Secondary | ICD-10-CM

## 2024-08-20 DIAGNOSIS — G4733 Obstructive sleep apnea (adult) (pediatric): Secondary | ICD-10-CM

## 2024-08-20 MED ORDER — KETOROLAC TROMETHAMINE 30 MG/ML IJ SOLN
30.0000 mg | Freq: Once | INTRAMUSCULAR | Status: AC
  Administered 2024-08-20: 30 mg via INTRAMUSCULAR

## 2024-08-20 MED ORDER — CYCLOBENZAPRINE HCL 5 MG PO TABS: 5.0000 mg | ORAL_TABLET | Freq: Two times a day (BID) | ORAL | 0 refills | Status: AC | PRN

## 2024-08-20 MED ORDER — PREDNISONE 10 MG (21) PO TBPK: ORAL_TABLET | Freq: Every day | ORAL | 0 refills | Status: AC

## 2024-08-20 NOTE — Discharge Instructions (Signed)
 Today you are being treated for a flare of your chronic back pain  You have been given an injection of Toradol  to help reduce inflammation and help with pain and ideally will see improvement within 30 minutes to an hour  Start tomorrow take your prednisone  as directed, take with food, may use Tylenol  or any topical medicines additionally  You may use muscle relaxant twice daily for additional comfort  You may use heating pad in 15 minute intervals as needed for additional comfort, within the first 2-3 days you may find comfort in using ice in 10-15 minutes over affected area  Begin stretching affected area daily for 10 minutes as tolerated to further loosen muscles   When lying down place pillow underneath and between knees for support  Can try sleeping without pillow on firm mattress   Practice good posture: head back, shoulders back, chest forward, pelvis back and weight distributed evenly on both legs  If pain persist after recommended treatment or reoccurs if may be beneficial to follow up with orthopedic specialist for evaluation, this doctor specializes in the bones and can manage your symptoms long-term with options such as but not limited to imaging, medications or physical therapy

## 2024-08-20 NOTE — ED Provider Notes (Addendum)
 " Chris Romero    CSN: [id number] Arrival date & time: 08/20/24  1412      History   Chief Complaint Chief Complaint  Patient presents with   Back Pain    Entered by patient    HPI Chris Romero is a 64 y.o. male.   Patient presents for evaluation of right-sided low back pain beginning 4 days ago without precipitating event, injury or trauma..  Symptoms exacerbated by all movement causing a intermittent sharp shooting pain.  Pain does not radiate, denies presence of numbness tingling or urinary symptoms.  Has attempted use of Advil and 1 dose of the muscle relaxant with only minimal relief.  All interpretation completed by family, declined formal interpreter    Past Medical History:  Diagnosis Date   Back pain    Rectal bleeding     Patient Active Problem List   Diagnosis Date Noted   Blood in stool    Benign neoplasm of transverse colon    Polyp of sigmoid colon     Past Surgical History:  Procedure Laterality Date   CHOLECYSTECTOMY     CIRCUMCISION     age 54   COLONOSCOPY WITH PROPOFOL  N/A 07/26/2016   Procedure: COLONOSCOPY WITH PROPOFOL ;  Surgeon: Rogelia Copping, MD;  Location: [facility];  Service: Endoscopy;  Laterality: N/A;   POLYPECTOMY N/A 07/26/2016   Procedure: POLYPECTOMY;  Surgeon: Rogelia Copping, MD;  Location: [facility];  Service: Endoscopy;  Laterality: N/A;       Home Medications    Prior to Admission medications  Medication Sig Start Date End Date Taking? Authorizing Provider  cyclobenzaprine  (FLEXERIL ) 5 MG tablet Take 1 tablet (5 mg total) by mouth 2 (two) times daily as needed for muscle spasms. 11/09/23  Yes Gerasimos Plotts, Shelba SAUNDERS, NP  polyethylene glycol (GOLYTELY) 236 g solution Drink one 8 oz glass every 20 mins until stools are clear 07/04/16  Yes Copping Rogelia, MD  predniSONE  (DELTASONE ) 20 MG tablet Take 2 tablets (40 mg total) by mouth daily with breakfast. 03/07/22   Arloa Suzen RAMAN, NP  tiZANidine  (ZANAFLEX ) 4 MG  tablet Take 1 tablet (4 mg total) by mouth every 12 (twelve) hours as needed for muscle spasms. 03/07/22   Arloa Suzen RAMAN, NP    Family History Family History  Problem Relation Age of Onset   Stroke Mother    Cataracts Father    Dementia Father     Social History Social History[1]   Allergies   Patient has no known allergies.   Review of Systems Review of Systems   Physical Exam Triage Vital Signs ED Triage Vitals  Encounter Vitals Group     BP 08/20/24 1428 109/66     Girls Systolic BP Percentile --      Girls Diastolic BP Percentile --      Boys Systolic BP Percentile --      Boys Diastolic BP Percentile --      Pulse Rate 08/20/24 1428 63     Resp 08/20/24 1428 17     Temp 08/20/24 1428 (!) 97.4 F (36.3 C)     Temp src --      SpO2 08/20/24 1428 92 %     Weight --      Height --      Head Circumference --      Peak Flow --      Pain Score 08/20/24 1426 6     Pain Loc --  Pain Education --      Exclude from Growth Chart --    No data found.  Updated Vital Signs BP 109/66   Pulse 63   Temp (!) 97.4 F (36.3 C)   Resp 17   SpO2 92%   Visual Acuity Right Eye Distance:   Left Eye Distance:   Bilateral Distance:    Right Eye Near:   Left Eye Near:    Bilateral Near:     Physical Exam Constitutional:      Appearance: Normal appearance.  Eyes:     Extraocular Movements: Extraocular movements intact.  Pulmonary:     Effort: Pulmonary effort is normal.  Musculoskeletal:     Comments: Tenderness present to the right latissimus dorsi adjacent to the spine, no spinal tenderness noted, able to sit upright without complication, negative straight leg test, able to complete full range of motion but pain is elicited with movement  Neurological:     Mental Status: He is alert and oriented to person, place, and time. Mental status is at baseline.      UC Treatments / Results  Labs (all labs ordered are listed, but only abnormal results are  displayed) Labs Reviewed - No data to display  EKG   Radiology No results found.  Procedures Procedures (including critical care time)  Medications Ordered in UC Medications - No data to display  Initial Impression / Assessment and Plan / UC Course  I have reviewed the triage vital signs and the nursing notes.  Pertinent labs & imaging results that were available during my care of the patient were reviewed by me and considered in my medical decision making (see chart for details).  Acute right-sided low back pain without sciatica  Etiology most likely flare of chronic back pain has been seen in clinic twice prior, no new injury therefore deferring imaging, Toradol  IM given and prescribed prednisone  and Flexeril  for home use, recommended supportive care through [facility], heat massage stretching with activity as tolerable, given walking referral to orthopedics if symptoms persist Final Clinical Impressions(s) / UC Diagnoses   Final diagnoses:  None   Discharge Instructions   None    ED Prescriptions   None    PDMP not reviewed this encounter.    Teresa Shelba SAUNDERS, NP 08/20/24 1551     [1]  Social History Tobacco Use   Smoking status: Every Day    Current packs/day: 0.50    Average packs/day: 0.5 packs/day for 30.0 years (15.0 ttl pk-yrs)    Types: Cigarettes   Smokeless tobacco: Never   Tobacco comments:    2 cigarettes daily  Vaping Use   Vaping status: Every Day  Substance Use Topics   Alcohol use: Yes    Comment: 2x/month   Drug use: No     Teresa Shelba SAUNDERS, NP 08/20/24 1552  "

## 2024-08-20 NOTE — ED Triage Notes (Signed)
 Pt present with c/o lower back pain. Pt sister states the pain is chronic but sometimes comes back stronger.

## 2024-09-01 ENCOUNTER — Telehealth: Payer: Self-pay | Admitting: Internal Medicine

## 2024-09-01 NOTE — Telephone Encounter (Signed)
 Received SS results. Scanned. FG to schedule Cpap titration-Toni

## 2024-09-04 NOTE — Procedures (Signed)
 "   Study Date: 08/20/2024  Patient Name: Chris Romero, Chris Romero Recording Device: Sharyne Cheers  Sex: Male Height: 70.0 in.  D.O.B.: 01/15/1961 Weight: 208.0 lbs.  Age: 64 years B.M.I: 29.8 lb/in2   Times and Durations  Lights off clock time:  10:51:35 PM Total Recording Time (TRT): 443.7 minutes  Lights on clock time: 6:15:17 AM Time In Bed (TIB): 443.7 minutes    Monitoring Time (MT): 436.2 minutes   Device and Sensor Details  The study was recorded on a [employer] device using 1 RIP effort belt and a pressure-based flow sensor. The heart rate is derived from the oximeter sensor and the snore signal is derived from the pressure sensor. The device also records body position and uses it to determine the monitoring time (sleep/wake periods).   Summary  REI 55.4 OAI 17.5 CAI 11.0 Lowest Desat 77  REI is the number of respiratory events per hour. OAI is the number of obstructive apneas per hour. CAI is the number of central apneas per hour. Lowest Desat is the lowest blood oxygen level that lasted at least 2 seconds.  RESPIRATORY EVENTS   Index (#/hour) Total # of Events Mean duration  (sec) Max duration  (sec) # of Events by Position       Supine Prone Left Right Up  Central Apneas 11.0 80 29.1 61.0 2    39 39 0  Obstructive Apneas 17.5 127 33.2 58.5 21    65 41 0  Mixed Apneas 8.5 62 39.3 59.5 3    45 14 0  Hypopneas 18.4 134 37.2 66.5 18    97 19 0  Apneas + Hypopneas 55.4 403 34.7 66.5 44    246 113 0  RERAs 0.0 0 0.0 0.0 0    0 0 0  Total 55.4 403 34.7 66.5 44    246 113 0  Time in Position 45.6    288.4 102.2 3.0  REI in Position 58.1    51.2 66.3 0.0    Positional Summary   Supine Non-Supine  Total # of Events 44 359.00  Total Duration (minutes) 45.6 393.60  Sleep Duration (minutes) 45.4 390.80  REI in Position 58.1 55.12  REISupine / REINon-Supine Ratio 1.05   Positional Sleep Apnea is indicated if REI (supine) >= 2 x REI (non-supine) per Santo, Sleep.  1984;7(2).    Oximetry Summary   Dur. (min) % TIB  <90 % 93.6 21.1  <85 % 6.3 1.4  <80 % 0.2 0.0  <70 % 0.0 0.0  Total Dur (min) < 89 52.7 min  Average (%) 91  Total # of Desats 430  Desat Index (#/hour) 59.2  Desat Max (%) 18  Desat Max dur (sec) 66.0  Lowest SpO2 % during sleep 77  Duration of Min SpO2 (sec) 4  Highest SpO2 % during sleep 97  Duration of Max SpO2(sec) 34    Heart Rate Stats  Mean HR during sleep 52.3 (BPM)  Highest HR during sleep 80  (BPM)  Highest HR during TIB  86 (BPM)  Lowest HR during sleep 43  (BPM)  Lowest HR during TIB 43 (BPM)    Snoring Summary  Total Snoring Episodes 18  Total Duration with Snoring 1.8 minutes  Mean Duration of Snoring 6.1 seconds  Percentage of Snoring 0.4 %                [facility]  Portable Polysomnogram Report Part 1 Phone: [phone] Fax: [phone])  [phone]  Patient Name: Chris, Romero  Recording Device: Sharyne Cheers  D.O.B.: 12-Jun-1961 Acquisition Number: [id number]  Referring Physician: Elfreda Bathe, MD Acquisition Date: 08/20/2024   History: The patient is a 64 year old male who was referred for re-evaluation of possible sleep apnea.  Medical History: OSA, back pain.  Medications: Flexeril , prednisone , tizanidine .  PROCEDURE  The unattended portable polysomnogram was conducted on the night of 08/20/2024.  The following parameters  were monitored: Nasal and oral airflow, and body position. Additionally, thoracic and abdominal movements were recorded by inductance plethysmography. Oxygen saturation (SpO2) and heart rate (ECG) was monitored using  a pulse oximeter.  The tracing was scored using 30 second epochs. Hypopneas were scored per AASM definition  VIII4.B (4% desaturation).   Description: The total recording time was 443.7 minutes. Sleep parameters are not recorded.  Respiratory monitoring demonstrated  snoring across the night in all positions. There were a total of 403  apneas  and hypopneas for a Respiratory Event Index of 55.4 apneas and hypopneas per hour of recording. The average duration of the respiratory events was 34.7 seconds with a maximum duration of 66.5 seconds. The respiratory events were associated with peripheral oxygen desaturations on the average to 91%. The lowest oxygen desaturation associated with a respiratory event was 77%. The total duration of oxygen < 90% was 93.6 minutes and < 80% was 0.2 minutes.   Cardiac monitoring- The average heart rate during the recording was 52.3 bpm.  Impression: This routine overnight portable polysomnogram confirmed the presence of severe obstructive sleep apnea. Overall, the Respiratory Event Index was 55.4 apneas and hypopneas per hour of recording with the lowest desaturation to 77%.  Recommendations:     A CPAP titration study is recommended due to the presence of obstructive sleep apnea.  Would recommend weight loss in a patient with a BMI of 29.8 lb./in2.  Elfreda DELENA Bathe, MD [facility] Diplomate ABMS Pulmonary Critical care and Sleep Medicine Electronically reviewed and digitally signed "

## 2024-10-14 ENCOUNTER — Encounter: Admitting: Internal Medicine

## 2024-10-19 ENCOUNTER — Ambulatory Visit: Admitting: Internal Medicine
